# Patient Record
Sex: Male | Born: 1980 | Race: White | Hispanic: No | Marital: Married | Smoking: Current every day smoker
Health system: Southern US, Community
[De-identification: ages and names within clinical notes are randomized; demographics above are authoritative.]

## PROBLEM LIST (undated history)

## (undated) DIAGNOSIS — T8859XA Other complications of anesthesia, initial encounter: Secondary | ICD-10-CM

## (undated) DIAGNOSIS — R51 Headache: Secondary | ICD-10-CM

## (undated) DIAGNOSIS — K4090 Unilateral inguinal hernia, without obstruction or gangrene, not specified as recurrent: Secondary | ICD-10-CM

## (undated) DIAGNOSIS — R519 Headache, unspecified: Secondary | ICD-10-CM

## (undated) DIAGNOSIS — Z87442 Personal history of urinary calculi: Secondary | ICD-10-CM

## (undated) DIAGNOSIS — T4145XA Adverse effect of unspecified anesthetic, initial encounter: Secondary | ICD-10-CM

## (undated) HISTORY — PX: WISDOM TOOTH EXTRACTION: SHX21

## (undated) HISTORY — PX: FRACTURE SURGERY: SHX138

---

## 2002-02-26 ENCOUNTER — Encounter: Admission: RE | Admit: 2002-02-26 | Discharge: 2002-02-26 | Payer: Self-pay | Admitting: Family Medicine

## 2002-10-14 ENCOUNTER — Encounter: Admission: RE | Admit: 2002-10-14 | Discharge: 2002-10-14 | Payer: Self-pay | Admitting: Family Medicine

## 2002-10-16 ENCOUNTER — Encounter: Admission: RE | Admit: 2002-10-16 | Discharge: 2002-10-16 | Payer: Self-pay | Admitting: Family Medicine

## 2006-05-24 DIAGNOSIS — F17200 Nicotine dependence, unspecified, uncomplicated: Secondary | ICD-10-CM

## 2011-05-11 ENCOUNTER — Other Ambulatory Visit: Payer: Self-pay | Admitting: Gastroenterology

## 2011-05-11 DIAGNOSIS — R1011 Right upper quadrant pain: Secondary | ICD-10-CM

## 2011-05-15 ENCOUNTER — Ambulatory Visit
Admission: RE | Admit: 2011-05-15 | Discharge: 2011-05-15 | Disposition: A | Discharge status: Home / Self Care | Payer: BC Managed Care – PPO | Source: Ambulatory Visit | Attending: Gastroenterology | Admitting: Gastroenterology

## 2011-05-15 DIAGNOSIS — R1011 Right upper quadrant pain: Secondary | ICD-10-CM

## 2011-05-15 MED ORDER — IOHEXOL 300 MG/ML  SOLN
100.0000 mL | Freq: Once | INTRAMUSCULAR | Status: AC | PRN
  Administered 2011-05-15: 100 mL via INTRAVENOUS

## 2013-04-18 ENCOUNTER — Encounter (HOSPITAL_COMMUNITY): Payer: Self-pay | Admitting: Emergency Medicine

## 2013-04-18 ENCOUNTER — Emergency Department (HOSPITAL_COMMUNITY): Payer: BC Managed Care – PPO

## 2013-04-18 ENCOUNTER — Emergency Department (HOSPITAL_COMMUNITY)
Admission: EM | Admit: 2013-04-18 | Discharge: 2013-04-18 | Disposition: A | Discharge status: Home / Self Care | Payer: BC Managed Care – PPO | Attending: Emergency Medicine | Admitting: Emergency Medicine

## 2013-04-18 DIAGNOSIS — Z791 Long term (current) use of non-steroidal anti-inflammatories (NSAID): Secondary | ICD-10-CM | POA: Insufficient documentation

## 2013-04-18 DIAGNOSIS — S79919A Unspecified injury of unspecified hip, initial encounter: Secondary | ICD-10-CM | POA: Insufficient documentation

## 2013-04-18 DIAGNOSIS — S79929A Unspecified injury of unspecified thigh, initial encounter: Principal | ICD-10-CM

## 2013-04-18 DIAGNOSIS — S99929A Unspecified injury of unspecified foot, initial encounter: Secondary | ICD-10-CM

## 2013-04-18 DIAGNOSIS — Y92410 Unspecified street and highway as the place of occurrence of the external cause: Secondary | ICD-10-CM | POA: Insufficient documentation

## 2013-04-18 DIAGNOSIS — Z7982 Long term (current) use of aspirin: Secondary | ICD-10-CM | POA: Insufficient documentation

## 2013-04-18 DIAGNOSIS — S99919A Unspecified injury of unspecified ankle, initial encounter: Secondary | ICD-10-CM

## 2013-04-18 DIAGNOSIS — S8990XA Unspecified injury of unspecified lower leg, initial encounter: Secondary | ICD-10-CM | POA: Insufficient documentation

## 2013-04-18 DIAGNOSIS — F17200 Nicotine dependence, unspecified, uncomplicated: Secondary | ICD-10-CM | POA: Insufficient documentation

## 2013-04-18 DIAGNOSIS — Y9389 Activity, other specified: Secondary | ICD-10-CM | POA: Insufficient documentation

## 2013-04-18 DIAGNOSIS — M79605 Pain in left leg: Secondary | ICD-10-CM

## 2013-04-18 MED ORDER — ONDANSETRON 4 MG PO TBDP
4.0000 mg | ORAL_TABLET | Freq: Once | ORAL | Status: AC
  Administered 2013-04-18: 4 mg via ORAL
  Filled 2013-04-18: qty 1

## 2013-04-18 MED ORDER — NAPROXEN 500 MG PO TABS: 500.0000 mg | ORAL_TABLET | Freq: Two times a day (BID) | ORAL | Status: DC

## 2013-04-18 MED ORDER — OXYCODONE-ACETAMINOPHEN 5-325 MG PO TABS
1.0000 | ORAL_TABLET | Freq: Once | ORAL | Status: AC
  Administered 2013-04-18: 1 via ORAL
  Filled 2013-04-18: qty 1

## 2013-04-18 MED ORDER — TRAMADOL HCL 50 MG PO TABS: 50.0000 mg | ORAL_TABLET | Freq: Four times a day (QID) | ORAL | Status: DC | PRN

## 2013-04-18 NOTE — Discharge Instructions (Signed)
Follow up with primary care provider in 2 days if pain persists. If your pain progressively worsens please return to the ED. Refer to resource guide for follow up reference.   Emergency Department Resource Guide 1) Find a Doctor and Pay Out of Pocket Although you won't have to find out who is covered by your insurance plan, it is a good idea to ask around and get recommendations. You will then need to call the office and see if the doctor you have chosen will accept you as a new patient and what types of options they offer for patients who are self-pay. Some doctors offer discounts or will set up payment plans for their patients who do not have insurance, but you will need to ask so you aren't surprised when you get to your appointment.  2) Contact Your Local Health Department Not all health departments have doctors that can see patients for sick visits, but many do, so it is worth a call to see if yours does. If you don't know where your local health department is, you can check in your phone book. The CDC also has a tool to help you locate your state's health department, and many state websites also have listings of all of their local health departments.  3) Find a Walk-in Clinic If your illness is not likely to be very severe or complicated, you may want to try a walk in clinic. These are popping up all over the country in pharmacies, drugstores, and shopping centers. They're usually staffed by nurse practitioners or physician assistants that have been trained to treat common illnesses and complaints. They're usually fairly quick and inexpensive. However, if you have serious medical issues or chronic medical problems, these are probably not your best option.  No Primary Care Doctor: - Call Health Connect at  [phone] - they can help you locate a primary care doctor that  accepts your insurance, provides certain services, etc. - Physician Referral Service- [phone]  Chronic Pain  Problems: Organization         Address  Phone   Notes  [facility]  [phone] Patients need to be referred by their primary care doctor.   Medication Assistance: Organization         Address  Phone   Notes  [facility] [address] [place], [place] [address] [phone] --Must be a resident of [place] [place] -- Must have NO insurance coverage whatsoever (no Medicaid/ Medicare, etc.) -- The pt. MUST have a primary care doctor that directs their care regularly and follows them in the community   MedAssist  [phone]   [employer]  [phone]    Agencies that provide inexpensive medical care: Organization         Address  Phone   Notes  [facility]  [phone]   [facility]    [phone]   [facility] [address] [place], [place] [address] [phone]   [facility] [address], [place] [phone]) [phone]   [facility]anned Parenthood    [phone]   [facility]    [phone]   [facility]  [address], [place] Phone:  [phone], Fax:  [phone] Hours of Operation:  9 am - 6 pm, M-F.  Also accepts Medicaid/Medicare and self-pay.  [facility]  [address], [place] Phone: [phone], Fax: [phone]. Hours of Operation:  8:30 am - 5:30 pm, M-F.  Also accepts Medicaid and self-pay.  [facility] [address], [place] Phone: [phone]   [place], [place], [place] [phone], Ext. [phone] Mondays & Thursdays: 7-9 AM.  First 15 patients are seen on a first come, first serve basis.    [place] Providers:  Organization         Address  Phone   Notes  [facility] [address], [place] [phone] Also  accepts self-pay patients.  [facility] [address], [place]  [phone]   [place], [address], [place] [phone]   [facility] [address] [place] [phone]   Lucianne Lei [address], [place]   [phone] Only accepts [place] Access [place] patients after they have their name applied to their card.   Self-Pay (no insurance) in [facility]:  Organization         Address  Phone   Notes  Sickle Cell Patients, [facility] [employer] [phone]   [facility] [place] [phone]   [facility] [place]  [place], [place], [place] [phone]   [facility]/Dr. Osei-Bonsu  [address] [place] or [address], [place] [phone] Phone number for both [place] and [place] locations is the same.  [facility] [address], [place] [phone]   [facility] [address] [place] or [address] [phone]   [facility] [address] [place] [phone], phone; [phone], fax Sees patients 1st and 3rd Saturday of every month.  Must not qualify for public or private insurance (i.e. Medicaid, Medicare, [place] Health Choice, Veterans' Benefits)  Household income should be no more than 200% of the poverty level The clinic cannot treat you if you are pregnant or think you are pregnant  Sexually transmitted diseases are not treated at the clinic.    Dental Care: Organization         Address  Phone  Notes  [facility] of [facility] [place] [phone] Accepts children up to age 58 who are enrolled in [place] or [place]; pregnant  women with a Medicaid card; and children who have applied for Medicaid or [place] Health Choice, but were declined, whose parents can pay a reduced fee at time of service.  [facility] Department of [facility]  [address], [place] [phone] Accepts children up to age 52 who are enrolled in [place] or [place]; pregnant women with a Medicaid card; and children who have applied for Medicaid or [place] Health Choice, but were declined, whose parents can pay a reduced fee at time of service.  [facility] PRO[facility]  [place] [phone] Patients are seen by appointment only. Walk-ins are not accepted. [place] will see patients 8 years of age and older. Monday - Tuesday (8am-5pm) Most Wednesdays (8:30-5pm) $30 per visit, cash only  [place]  PROGRAM  [address], [facility] [phone] Patients are seen by appointment only. Walk-ins are not accepted. [place] will see patients 73 years of age and older. One Wednesday Evening (Monthly: Volunteer Based).  $30 per visit, cash only  [place]  [phone] for adults; Children under age 67, call Graduate Pediatric Dentistry at [phone]. Children aged 69-14, please call [phone] to request a pediatric application.  Dental services are provided in all areas of dental care including fillings, crowns and bridges, complete and partial dentures, implants, gum treatment, root canals, and extractions. Preventive care is also provided. Treatment is provided to both adults and children. Patients are selected via a lottery and there is often a waiting list.   [facility] [address], [place]  [phone] [email or web address]   [facility] [address] [place], [place] [phone], Ext. [phone] Second and Fourth Thursday of each month, opens at 6:30 AM; Clinic ends at 9 AM.  Patients are  seen on a first-come first-served basis, and a limited number are seen during each clinic.   [facility]  [address] [place], [place] [phone]   Eligibility Requirements You must have lived in [place], [place], or [place] counties for at least the last three months.   You cannot be eligible for state or federal sponsored [employer], including [employer], [place], or [employer].   You generally cannot be eligible for healthcare insurance through your employer.    How to apply: Eligibility screenings are held every Tuesday and Wednesday afternoon from 1:00 pm until 4:00 pm. You do not need an appointment for the interview!  [facility] [address], [place], [place]   [place] Department  [place]  [phone]    Behavioral Health Resources in the Community: Intensive Outpatient Programs Organization         Address  Phone  Notes  [place]. [address], [place], [place] [phone]   [facility] Outpatient [address] [place], [place]   ADS: Alcohol & Drug Svcs [address], [place], [place]   [place] [address]  [place], [place] or [phone]   Substance Abuse Resources Organization         Address  Phone  Notes  Alcohol and Drug Services  [phone]   [place]  [phone]   [facility] [place]   Chinita Pester  [phone]   Residential & Outpatient Substance Abuse Program  [phone]   Psychological Services Organization         Address  Phone  Notes  [facility] [place]  [place]  [phone]   [place] [address], [place] or [phone]    Mobile Crisis  Teams Organization         Address  Phone  Notes  [facility], Mobile Crisis Care Unit  [phone]   [facility]  [address] [place], [place]   [place] Levels [address], [place] [phone]    Self-Help/Support Groups Organization         Address  Phone             Notes  [place]  of [place] - variety of support groups  [place] Call for more information  Narcotics Anonymous (NA), [facility]aring [facility] [address], [employer] [place]  2 meetings at this location   Special educational needs teacher         Address  Phone  Notes  [facility] [place],    [place]  [phone]   [facility]  [address], [place] [address]4008, [place], [place]   [place], [place] [phone] Admissions: 8am-3pm M-F  Incentives Substance [place] [address],    [place], [place] [phone]   [facility] [address] [place], [place], [place]   [facility] [address]  [place], [place]   [facility] Programs - Intensive Outpatient [address], [place], [place]   [facility] ([place] [address].,  [place], [place] [phone] or [phone]   [facility] Services (RTS) [address], [place], [place] Accepts Medicaid  [facility] [place] [address],  [place] [phone] Substance Abuse/Addiction Treatment   [facility] Organization         Address  Phone  Notes  [facility]  [phone]   Domenic Schwab, PhD [address] [place], [place]   [phone] or [phone]   [facility], [place] [phone]   [facility] [address], [place], [place] [phone]  Insurance/Medicaid/sponsorship through [facility] Families [address]                                    [place], [place] [phone] [place] [address], [place] [phone]    Dr. Adele Schilder  [phone]   [facility] Dept. 1) [address]  2) [place] 3)  [place] [address], [place] [phone]  [phone]   [place] [phone] or [phone] (After Hours)
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
## 2013-04-24 NOTE — ED Provider Notes (Signed)
Medical screening examination/treatment/procedure(s) were performed by non-physician practitioner and as supervising physician I was immediately available for consultation/collaboration.     Geoffery Lyonsouglas English Tomer, MD 04/24/13 [phone]

## 2013-05-09 ENCOUNTER — Ambulatory Visit: Payer: BC Managed Care – PPO | Attending: Orthopaedic Surgery

## 2013-05-09 DIAGNOSIS — M25559 Pain in unspecified hip: Secondary | ICD-10-CM | POA: Insufficient documentation

## 2013-05-09 DIAGNOSIS — IMO0001 Reserved for inherently not codable concepts without codable children: Secondary | ICD-10-CM | POA: Insufficient documentation

## 2013-05-09 DIAGNOSIS — R269 Unspecified abnormalities of gait and mobility: Secondary | ICD-10-CM | POA: Insufficient documentation

## 2013-05-09 DIAGNOSIS — M2560 Stiffness of unspecified joint, not elsewhere classified: Secondary | ICD-10-CM | POA: Insufficient documentation

## 2013-05-09 DIAGNOSIS — M25569 Pain in unspecified knee: Secondary | ICD-10-CM | POA: Insufficient documentation

## 2013-05-12 ENCOUNTER — Ambulatory Visit: Payer: BC Managed Care – PPO

## 2013-05-14 ENCOUNTER — Ambulatory Visit: Payer: BC Managed Care – PPO

## 2013-05-19 ENCOUNTER — Ambulatory Visit: Payer: BC Managed Care – PPO | Admitting: Physical Therapy

## 2013-05-21 ENCOUNTER — Ambulatory Visit: Payer: BC Managed Care – PPO

## 2013-05-26 ENCOUNTER — Ambulatory Visit: Payer: BC Managed Care – PPO | Attending: Orthopaedic Surgery | Admitting: Physical Therapy

## 2013-05-26 DIAGNOSIS — M25569 Pain in unspecified knee: Secondary | ICD-10-CM | POA: Insufficient documentation

## 2013-05-26 DIAGNOSIS — IMO0001 Reserved for inherently not codable concepts without codable children: Secondary | ICD-10-CM | POA: Insufficient documentation

## 2013-05-26 DIAGNOSIS — M25559 Pain in unspecified hip: Secondary | ICD-10-CM | POA: Insufficient documentation

## 2013-05-26 DIAGNOSIS — R269 Unspecified abnormalities of gait and mobility: Secondary | ICD-10-CM | POA: Insufficient documentation

## 2013-05-26 DIAGNOSIS — M2560 Stiffness of unspecified joint, not elsewhere classified: Secondary | ICD-10-CM | POA: Insufficient documentation

## 2013-05-29 ENCOUNTER — Ambulatory Visit: Payer: BC Managed Care – PPO | Admitting: Physical Therapy

## 2013-06-02 ENCOUNTER — Encounter: Payer: BC Managed Care – PPO | Admitting: Physical Therapy

## 2014-08-03 NOTE — Progress Notes (Signed)
Dr Derrell Lollingamirez--- Need PRE OP ORDERS PLEASE--- has  PST appt 08/09/16  Thanks

## 2014-08-06 ENCOUNTER — Ambulatory Visit: Payer: Self-pay | Admitting: General Surgery

## 2014-08-06 NOTE — H&P (Signed)
History of Present Illness (Daissy Yerian MD; 07/20/2014 3:43 PM) Patient words: RIH.  The patient is a 33 year old male who presents with an inguinal hernia. 33-year-old male who is referred by Scott long, PA-C for evaluation of a right inguinal hernia. The patient works with [employer] and does heavy lifting. Patient states she's notices hernia to the right side with some pain radiating down the inner right thigh. He does notice a bulge. He's had no signs or symptoms of incarceration or strangulation.   Other Problems ([name], CMA; 07/20/2014 3:18 PM) No pertinent past medical history  Diagnostic Studies History ([name], CMA; 07/20/2014 3:18 PM) Colonoscopy never  Allergies ([name], CMA; 07/20/2014 3:19 PM) Codeine Phosphate *ANALGESICS - OPIOID*  Medication History ([name], CMA; 07/20/2014 3:20 PM) Aspirin EC (81MG Tablet DR, Oral) Active. Hydrocodone-Acetaminophen (2.5-500MG Tablet, Oral) Active. Cipro (250MG Tablet, Oral) Active. Medications Reconciled  Social History ([name], CMA; 07/20/2014 3:18 PM) Alcohol use Occasional alcohol use. Caffeine use Coffee, Tea. No drug use Tobacco use Current every day smoker.  Family History ([name], CMA; 07/20/2014 3:18 PM) Colon Cancer Family Members In General.  Review of Systems ([name] CMA; 07/20/2014 3:18 PM) General Present- Appetite Loss and Fatigue. Not Present- Chills, Fever, Night Sweats, Weight Gain and Weight Loss. Skin Present- Rash. Not Present- Change in Wart/Mole, Dryness, Hives, Jaundice, New Lesions, Non-Healing Wounds and Ulcer. HEENT Present- Seasonal Allergies, Sinus Pain and Sore Throat. Not Present- Earache, Hearing Loss, Hoarseness, Nose Bleed, Oral Ulcers, Ringing in the Ears, Visual Disturbances, Wears glasses/contact lenses and Yellow Eyes. Gastrointestinal Present- Abdominal Pain, Bloating, Change in Bowel Habits, Constipation, Excessive gas, Gets full quickly at  meals, Nausea and Rectal Pain. Not Present- Bloody Stool, Chronic diarrhea, Difficulty Swallowing, Hemorrhoids, Indigestion and Vomiting. Male Genitourinary Present- Frequency. Not Present- Blood in Urine, Change in Urinary Stream, Impotence, Nocturia, Painful Urination, Urgency and Urine Leakage. Musculoskeletal Present- Back Pain, Joint Pain, Joint Stiffness and Muscle Pain. Not Present- Muscle Weakness and Swelling of Extremities. Neurological Present- Headaches, Numbness and Trouble walking. Not Present- Decreased Memory, Fainting, Seizures, Tingling, Tremor and Weakness. Psychiatric Present- Change in Sleep Pattern and Fearful. Not Present- Anxiety, Bipolar, Depression and Frequent crying.   Vitals ([name] CMA; 07/20/2014 3:19 PM) 07/20/2014 3:18 PM Weight: 172 lb Height: 72in Body Surface Area: 1.99 m Body Mass Index: 23.33 kg/m Temp.: 98F(Temporal)  Pulse: 90 (Regular)  BP: 124/78 (Sitting, Left Arm, Standard)    Physical Exam (Parley Pidcock MD; 07/20/2014 3:40 PM) General Mental Status-Alert. General Appearance-Consistent with stated age. Hydration-Well hydrated. Voice-Normal.  Head and Neck Head-normocephalic, atraumatic with no lesions or palpable masses. Trachea-midline. Thyroid Gland Characteristics - normal size and consistency.  Chest and Lung Exam Chest and lung exam reveals -quiet, even and easy respiratory effort with no use of accessory muscles and on auscultation, normal breath sounds, no adventitious sounds and normal vocal resonance. Inspection Chest Wall - Normal. Back - normal.  Cardiovascular Cardiovascular examination reveals -normal heart sounds, regular rate and rhythm with no murmurs and normal pedal pulses bilaterally.  Abdomen Inspection Skin - Scar - no surgical scars. Hernias - Inguinal hernia - Bilateral - Reducible. Palpation/Percussion Normal exam - Soft, Non Tender, No Rebound tenderness, No Rigidity  (guarding) and No hepatosplenomegaly. Auscultation Normal exam - Bowel sounds normal.    Assessment & Plan (Jaxiel Kines MD; 07/20/2014 3:44 PM) BILATERAL INGUINAL HERNIA WITHOUT OBSTRUCTION OR GANGRENE, RECURRENCE NOT SPECIFIED (550.92  K40.20) Impression: 33-year-old male with right angle hernia left   inguinal floor weakness.  1. The patient will like to proceed to the operative for laparoscopic bilateral inguinal hernia repair with mesh. 2. All risks and benefits were discussed with the patient to generally include, but not limited to: infection, bleeding, damage to surrounding structures, acute and chronic nerve pain, and recurrence. Alternatives were offered and described. All questions were answered and the patient voiced understanding of the procedure and wishes to proceed at this point with hernia repair.

## 2014-08-07 NOTE — Patient Instructions (Addendum)
Henry Peck  08/07/2014   Your procedure is scheduled on: 08-14-2014  [facility]  Entrance and follow signs to               Short Stay Center at 9:30 AM.  Call this number if you have problems the morning of surgery [phone]   Remember: ONLY 1 PERSON MAY GO WITH YOU TO SHORT STAY TO GET  READY MORNING OF YOUR SURGERY.  Do not eat food or drink liquids :After Midnight.                                 You may not have any metal on your body including hair pins and              piercings  Do not wear jewelry, make-up, lotions, powders or perfumes, deodorant             Do not wear nail polish.  Do not shave  48 hours prior to surgery.              Men may shave face and neck.  Do not bring valuables to the hospital. [facility] IS NOT             RESPONSIBLE   FOR VALUABLES.  Contacts, dentures or bridgework may not be worn into surgery.  Leave suitcase in the car. After surgery it may be brought to your room.     Patients discharged the day of surgery will not be allowed to drive home.  Name and phone number of your driver: Henry CosierCrystal (wife) [phone]    _____________________________________________________________________           [facility] - Preparing for Surgery Before surgery, you can play an important role.  Because skin is not sterile, your skin needs to be as free of germs as possible.  You can reduce the number of germs on your skin by washing with CHG (chlorahexidine gluconate) soap before surgery.  CHG is an antiseptic cleaner which kills germs and bonds with the skin to continue killing germs even after washing. Please DO NOT use if you have an allergy to CHG or antibacterial soaps.  If your skin becomes reddened/irritated stop using the CHG and inform your nurse when you arrive at Short Stay. Do not shave (including legs and underarms) for at least 48 hours prior to the first CHG shower.  You may shave your face/neck. Please  follow these instructions carefully:  1.  Shower with CHG Soap the night before surgery and the  morning of Surgery.  2.  If you choose to wash your hair, wash your hair first as usual with your  normal  shampoo.  3.  After you shampoo, rinse your hair and body thoroughly to remove the  shampoo.                            4.  Use CHG as you would any other liquid soap.  You can apply chg directly  to the skin and wash                       Gently with a scrungie or clean washcloth.  5.  Apply the CHG Soap to your body ONLY FROM THE NECK  DOWN.   Do not use on face/ open                           Wound or open sores. Avoid contact with eyes, ears mouth and genitals (private parts).                       Wash face,  Genitals (private parts) with your normal soap.             6.  Wash thoroughly, paying special attention to the area where your surgery  will be performed.  7.  Thoroughly rinse your body with warm water from the neck down.  8.  DO NOT shower/wash with your normal soap after using and rinsing off  the CHG Soap.                9.  Pat yourself dry with a clean towel.            10.  Wear clean pajamas.            11.  Place clean sheets on your bed the night of your first shower and do not  sleep with pets. Day of Surgery : Do not apply any lotions/deodorants the morning of surgery.  Please wear clean clothes to the hospital/surgery center.  FAILURE TO FOLLOW THESE INSTRUCTIONS MAY RESULT IN THE CANCELLATION OF YOUR SURGERY PATIENT SIGNATURE_________________________________  NURSE SIGNATURE__________________________________  ________________________________________________________________________

## 2014-08-10 ENCOUNTER — Encounter (HOSPITAL_COMMUNITY): Payer: Self-pay

## 2014-08-10 ENCOUNTER — Encounter (HOSPITAL_COMMUNITY)
Admission: RE | Admit: 2014-08-10 | Discharge: 2014-08-10 | Disposition: A | Discharge status: Home / Self Care | Payer: BLUE CROSS/BLUE SHIELD | Source: Ambulatory Visit | Attending: General Surgery | Admitting: General Surgery

## 2014-08-10 DIAGNOSIS — Z01812 Encounter for preprocedural laboratory examination: Secondary | ICD-10-CM | POA: Insufficient documentation

## 2014-08-10 DIAGNOSIS — K4020 Bilateral inguinal hernia, without obstruction or gangrene, not specified as recurrent: Secondary | ICD-10-CM | POA: Diagnosis not present

## 2014-08-10 HISTORY — DX: Other complications of anesthesia, initial encounter: T88.59XA

## 2014-08-10 HISTORY — DX: Headache, unspecified: R51.9

## 2014-08-10 HISTORY — DX: Unilateral inguinal hernia, without obstruction or gangrene, not specified as recurrent: K40.90

## 2014-08-10 HISTORY — DX: Adverse effect of unspecified anesthetic, initial encounter: T41.45XA

## 2014-08-10 HISTORY — DX: Headache: R51

## 2014-08-10 HISTORY — DX: Personal history of urinary calculi: Z87.442

## 2014-08-10 LAB — CBC
HCT: 46.6 % (ref 39.0–52.0)
Hemoglobin: 15.8 g/dL (ref 13.0–17.0)
MCH: 31.5 pg (ref 26.0–34.0)
MCHC: 33.9 g/dL (ref 30.0–36.0)
MCV: 92.8 fL (ref 78.0–100.0)
Platelets: 381 10*3/uL (ref 150–400)
RBC: 5.02 MIL/uL (ref 4.22–5.81)
RDW: 12.8 % (ref 11.5–15.5)
WBC: 9.3 10*3/uL (ref 4.0–10.5)

## 2014-08-14 ENCOUNTER — Ambulatory Visit (HOSPITAL_COMMUNITY)
Admission: RE | Admit: 2014-08-14 | Discharge: 2014-08-14 | Disposition: A | Discharge status: Home / Self Care | Payer: BLUE CROSS/BLUE SHIELD | Source: Ambulatory Visit | Attending: General Surgery | Admitting: General Surgery

## 2014-08-14 ENCOUNTER — Ambulatory Visit (HOSPITAL_COMMUNITY): Payer: BLUE CROSS/BLUE SHIELD | Admitting: Anesthesiology

## 2014-08-14 ENCOUNTER — Encounter (HOSPITAL_COMMUNITY): Payer: Self-pay | Admitting: *Deleted

## 2014-08-14 ENCOUNTER — Encounter (HOSPITAL_COMMUNITY)
Admission: RE | Disposition: A | Discharge status: Home / Self Care | Payer: Self-pay | Source: Ambulatory Visit | Attending: General Surgery

## 2014-08-14 DIAGNOSIS — K4020 Bilateral inguinal hernia, without obstruction or gangrene, not specified as recurrent: Secondary | ICD-10-CM | POA: Insufficient documentation

## 2014-08-14 DIAGNOSIS — Z79891 Long term (current) use of opiate analgesic: Secondary | ICD-10-CM | POA: Diagnosis not present

## 2014-08-14 DIAGNOSIS — F17200 Nicotine dependence, unspecified, uncomplicated: Secondary | ICD-10-CM | POA: Insufficient documentation

## 2014-08-14 DIAGNOSIS — Z792 Long term (current) use of antibiotics: Secondary | ICD-10-CM | POA: Diagnosis not present

## 2014-08-14 DIAGNOSIS — Z7982 Long term (current) use of aspirin: Secondary | ICD-10-CM | POA: Insufficient documentation

## 2014-08-14 HISTORY — PX: INSERTION OF MESH: SHX5868

## 2014-08-14 HISTORY — PX: INGUINAL HERNIA REPAIR: SHX194

## 2014-08-14 SURGERY — REPAIR, HERNIA, INGUINAL, BILATERAL, LAPAROSCOPIC
Anesthesia: General | Site: Groin | Laterality: Bilateral

## 2014-08-14 MED ORDER — BUPIVACAINE-EPINEPHRINE (PF) 0.25% -1:200000 IJ SOLN
INTRAMUSCULAR | Status: AC
  Filled 2014-08-14: qty 30

## 2014-08-14 MED ORDER — CEFAZOLIN SODIUM-DEXTROSE 2-3 GM-% IV SOLR
INTRAVENOUS | Status: AC
  Filled 2014-08-14: qty 50

## 2014-08-14 MED ORDER — PROPOFOL 10 MG/ML IV BOLUS
INTRAVENOUS | Status: AC
  Filled 2014-08-14: qty 20

## 2014-08-14 MED ORDER — MORPHINE SULFATE 10 MG/ML IJ SOLN: 1.0000 mg | INTRAMUSCULAR | Status: DC | PRN

## 2014-08-14 MED ORDER — CEFAZOLIN SODIUM-DEXTROSE 2-3 GM-% IV SOLR
2.0000 g | INTRAVENOUS | Status: AC
  Administered 2014-08-14: 2 g via INTRAVENOUS

## 2014-08-14 MED ORDER — SODIUM CHLORIDE 0.9 % IJ SOLN: 3.0000 mL | Freq: Two times a day (BID) | INTRAMUSCULAR | Status: DC

## 2014-08-14 MED ORDER — ROCURONIUM BROMIDE 100 MG/10ML IV SOLN
INTRAVENOUS | Status: AC
  Filled 2014-08-14: qty 1

## 2014-08-14 MED ORDER — NEOSTIGMINE METHYLSULFATE 10 MG/10ML IV SOLN
INTRAVENOUS | Status: DC | PRN
  Administered 2014-08-14: 4 mg via INTRAVENOUS

## 2014-08-14 MED ORDER — HYDROMORPHONE HCL 1 MG/ML IJ SOLN
0.2500 mg | INTRAMUSCULAR | Status: DC | PRN
  Administered 2014-08-14: 0.25 mg via INTRAVENOUS
  Administered 2014-08-14: 0.5 mg via INTRAVENOUS
  Administered 2014-08-14: 0.25 mg via INTRAVENOUS

## 2014-08-14 MED ORDER — MIDAZOLAM HCL 2 MG/2ML IJ SOLN
INTRAMUSCULAR | Status: AC
  Filled 2014-08-14: qty 2

## 2014-08-14 MED ORDER — METOCLOPRAMIDE HCL 5 MG/ML IJ SOLN
INTRAMUSCULAR | Status: DC | PRN
  Administered 2014-08-14: 10 mg via INTRAVENOUS

## 2014-08-14 MED ORDER — OXYCODONE-ACETAMINOPHEN 5-325 MG PO TABS: 1.0000 | ORAL_TABLET | ORAL | Status: DC | PRN

## 2014-08-14 MED ORDER — LACTATED RINGERS IV SOLN
INTRAVENOUS | Status: DC
  Administered 2014-08-14: 1000 mL via INTRAVENOUS

## 2014-08-14 MED ORDER — LACTATED RINGERS IV SOLN
INTRAVENOUS | Status: DC | PRN
  Administered 2014-08-14: 1000 mL

## 2014-08-14 MED ORDER — LACTATED RINGERS IV SOLN: INTRAVENOUS | Status: DC

## 2014-08-14 MED ORDER — PROPOFOL 10 MG/ML IV BOLUS
INTRAVENOUS | Status: DC | PRN
  Administered 2014-08-14: 200 mg via INTRAVENOUS

## 2014-08-14 MED ORDER — ONDANSETRON HCL 4 MG/2ML IJ SOLN
INTRAMUSCULAR | Status: DC | PRN
  Administered 2014-08-14: 4 mg via INTRAVENOUS

## 2014-08-14 MED ORDER — ONDANSETRON HCL 4 MG/2ML IJ SOLN
INTRAMUSCULAR | Status: AC
  Filled 2014-08-14: qty 2

## 2014-08-14 MED ORDER — OXYCODONE HCL 5 MG PO TABS
5.0000 mg | ORAL_TABLET | ORAL | Status: DC | PRN
  Administered 2014-08-14 (×2): 5 mg via ORAL
  Filled 2014-08-14 (×2): qty 1

## 2014-08-14 MED ORDER — ROCURONIUM BROMIDE 100 MG/10ML IV SOLN
INTRAVENOUS | Status: DC | PRN
  Administered 2014-08-14: 45 mg via INTRAVENOUS
  Administered 2014-08-14: 10 mg via INTRAVENOUS

## 2014-08-14 MED ORDER — NEOSTIGMINE METHYLSULFATE 10 MG/10ML IV SOLN
INTRAVENOUS | Status: AC
  Filled 2014-08-14: qty 1

## 2014-08-14 MED ORDER — MIDAZOLAM HCL 5 MG/5ML IJ SOLN
INTRAMUSCULAR | Status: DC | PRN
  Administered 2014-08-14: 2 mg via INTRAVENOUS

## 2014-08-14 MED ORDER — LIDOCAINE HCL (CARDIAC) 20 MG/ML IV SOLN
INTRAVENOUS | Status: AC
Start: 2014-08-14 — End: 2014-08-14
  Filled 2014-08-14: qty 5

## 2014-08-14 MED ORDER — FENTANYL CITRATE (PF) 250 MCG/5ML IJ SOLN
INTRAMUSCULAR | Status: AC
  Filled 2014-08-14: qty 5

## 2014-08-14 MED ORDER — LIDOCAINE HCL (CARDIAC) 20 MG/ML IV SOLN
INTRAVENOUS | Status: DC | PRN
  Administered 2014-08-14: 50 mg via INTRAVENOUS

## 2014-08-14 MED ORDER — GLYCOPYRROLATE 0.2 MG/ML IJ SOLN
INTRAMUSCULAR | Status: AC
  Filled 2014-08-14: qty 3

## 2014-08-14 MED ORDER — ACETAMINOPHEN 325 MG PO TABS: 650.0000 mg | ORAL_TABLET | ORAL | Status: DC | PRN

## 2014-08-14 MED ORDER — HYDROMORPHONE HCL 1 MG/ML IJ SOLN
INTRAMUSCULAR | Status: AC
  Filled 2014-08-14: qty 1

## 2014-08-14 MED ORDER — SODIUM CHLORIDE 0.9 % IV SOLN: 250.0000 mL | INTRAVENOUS | Status: DC | PRN

## 2014-08-14 MED ORDER — LACTATED RINGERS IV SOLN
INTRAVENOUS | Status: DC | PRN
  Administered 2014-08-14 (×2): via INTRAVENOUS

## 2014-08-14 MED ORDER — 0.9 % SODIUM CHLORIDE (POUR BTL) OPTIME
TOPICAL | Status: DC | PRN
  Administered 2014-08-14: 1000 mL

## 2014-08-14 MED ORDER — SODIUM CHLORIDE 0.9 % IJ SOLN: 3.0000 mL | INTRAMUSCULAR | Status: DC | PRN

## 2014-08-14 MED ORDER — GLYCOPYRROLATE 0.2 MG/ML IJ SOLN
INTRAMUSCULAR | Status: DC | PRN
  Administered 2014-08-14: 0.6 mg via INTRAVENOUS

## 2014-08-14 MED ORDER — FENTANYL CITRATE (PF) 100 MCG/2ML IJ SOLN
INTRAMUSCULAR | Status: DC | PRN
  Administered 2014-08-14: 100 ug via INTRAVENOUS
  Administered 2014-08-14 (×2): 50 ug via INTRAVENOUS

## 2014-08-14 MED ORDER — ACETAMINOPHEN 650 MG RE SUPP
650.0000 mg | RECTAL | Status: DC | PRN
  Filled 2014-08-14: qty 1

## 2014-08-14 MED ORDER — BUPIVACAINE-EPINEPHRINE 0.25% -1:200000 IJ SOLN
INTRAMUSCULAR | Status: DC | PRN
  Administered 2014-08-14: 8 mL

## 2014-08-14 MED ORDER — LIP MEDEX EX OINT
TOPICAL_OINTMENT | CUTANEOUS | Status: AC
  Filled 2014-08-14: qty 7

## 2014-08-14 SURGICAL SUPPLY — 42 items
APL SKNCLS STERI-STRIP NONHPOA (GAUZE/BANDAGES/DRESSINGS) ×1
BAG URINE DRAINAGE (UROLOGICAL SUPPLIES) IMPLANT
BENZOIN TINCTURE PRP APPL 2/3 (GAUZE/BANDAGES/DRESSINGS) ×3 IMPLANT
CABLE HIGH FREQUENCY MONO STRZ (ELECTRODE) ×3 IMPLANT
CATH FOLEY 3WAY  5CC 16FR (CATHETERS)
CATH FOLEY 3WAY 5CC 16FR (CATHETERS) IMPLANT
CLOSURE WOUND 1/2 X4 (GAUZE/BANDAGES/DRESSINGS) ×1
CONNECTOR 5 IN 1 STRAIGHT STRL (MISCELLANEOUS) IMPLANT
DECANTER SPIKE VIAL GLASS SM (MISCELLANEOUS) ×1 IMPLANT
DRAPE LAPAROSCOPIC ABDOMINAL (DRAPES) ×3 IMPLANT
ELECT REM PT RETURN 9FT ADLT (ELECTROSURGICAL) ×3
ELECTRODE REM PT RTRN 9FT ADLT (ELECTROSURGICAL) ×1 IMPLANT
GAUZE SPONGE 2X2 8PLY STRL LF (GAUZE/BANDAGES/DRESSINGS) ×1 IMPLANT
GLOVE BIO SURGEON STRL SZ 6.5 (GLOVE) ×1 IMPLANT
GLOVE BIO SURGEON STRL SZ7.5 (GLOVE) ×3 IMPLANT
GLOVE BIO SURGEONS STRL SZ 6.5 (GLOVE) ×1
GLOVE BIOGEL PI IND STRL 6.5 (GLOVE) IMPLANT
GLOVE BIOGEL PI IND STRL 7.0 (GLOVE) IMPLANT
GLOVE BIOGEL PI INDICATOR 6.5 (GLOVE) ×2
GLOVE BIOGEL PI INDICATOR 7.0 (GLOVE) ×2
GOWN STRL REUS W/TWL XL LVL3 (GOWN DISPOSABLE) ×8 IMPLANT
KIT BASIN OR (CUSTOM PROCEDURE TRAY) ×3 IMPLANT
MESH 3DMAX 4X6 LT LRG (Mesh General) ×2 IMPLANT
MESH 3DMAX 4X6 RT LRG (Mesh General) ×2 IMPLANT
NDL INSUFFLATION 14GA 120MM (NEEDLE) IMPLANT
NEEDLE INSUFFLATION 14GA 120MM (NEEDLE) ×3 IMPLANT
PLUG CATH AND CAP STER (CATHETERS) IMPLANT
RELOAD STAPLE 4.0 BLU F/HERNIA (INSTRUMENTS) ×1 IMPLANT
RELOAD STAPLE HERNIA 4.0 BLUE (INSTRUMENTS) ×3 IMPLANT
SCISSORS LAP 5X35 DISP (ENDOMECHANICALS) ×2 IMPLANT
SET IRRIG TUBING LAPAROSCOPIC (IRRIGATION / IRRIGATOR) ×2 IMPLANT
SET IRRIG Y TYPE TUR BLADDER L (SET/KITS/TRAYS/PACK) IMPLANT
SPONGE GAUZE 2X2 STER 10/PKG (GAUZE/BANDAGES/DRESSINGS) ×2
STAPLER HERNIA 12 8.5 360D (INSTRUMENTS) ×3 IMPLANT
STRIP CLOSURE SKIN 1/2X4 (GAUZE/BANDAGES/DRESSINGS) ×2 IMPLANT
SUT MNCRL AB 4-0 PS2 18 (SUTURE) ×3 IMPLANT
TOWEL OR 17X26 10 PK STRL BLUE (TOWEL DISPOSABLE) ×3 IMPLANT
TRAY FOLEY W/METER SILVER 14FR (SET/KITS/TRAYS/PACK) ×1 IMPLANT
TRAY FOLEY W/METER SILVER 16FR (SET/KITS/TRAYS/PACK) ×2 IMPLANT
TRAY LAPAROSCOPIC (CUSTOM PROCEDURE TRAY) ×3 IMPLANT
TROCAR CANNULA W/PORT DUAL 5MM (MISCELLANEOUS) ×3 IMPLANT
TROCAR XCEL 12X100 BLDLESS (ENDOMECHANICALS) ×3 IMPLANT

## 2014-08-14 NOTE — Op Note (Signed)
08/14/2014  12:36 PM  PATIENT:  Henry Peck  34 y.o. male  PRE-OPERATIVE DIAGNOSIS:  Bilateral Inguinal Hernia  POST-OPERATIVE DIAGNOSIS:  Bilateral Inguinal Hernia  PROCEDURE:  Procedure(s): LAPAROSCOPIC BILATERAL INDIRECT  INGUINAL HERNIA REPAIR (Bilateral) INSERTION OF MESH (Bilateral)  SURGEON:  Surgeon(s) and Role:    * Axel FillerArmando Shahab Polhamus, MD - Primary  ANESTHESIA:   local and general  EBL:   <5cc  BLOOD ADMINISTERED:none  DRAINS: none   LOCAL MEDICATIONS USED:  BUPIVICAINE   SPECIMEN:  No Specimen  DISPOSITION OF SPECIMEN:  N/A  COUNTS:  YES  TOURNIQUET:  * No tourniquets in log *  DICTATION: .Dragon Dictation Details of the procedure: The patient was taken back to the operating room. The patient was placed in supine position with bilateral SCDs in place.  The patient was prepped and draped in the usual sterile fashion.  After appropriate anitbiotics were confirmed, a time-out was confirmed and all facts were verified.  0.25% Marcaine was used to infiltrate the umbilical area. A 11-blade was used to cut down the skin and blunt dissection was used to get the anterior fashion.  The anterior fascia was incised approximately 1 cm and the muscles were retracted laterally. Blunt dissection was then used to create a space in the preperitoneal area on the right side. At this time a 10 mm camera was then introduced into the space and advanced the pubic tubercle and a 12 mm trocar was placed over this and insufflation was started.  At this time and space was created from medial to laterally the preperitoneal space on the right side.  Cooper's ligament was initially cleaned off.  The small hernia sac was identified in the right indirect space. Dissection of the hernia sac was undertaken the vas deferens was identified and protected in all parts of the case.   Once the hernia sac was taken down to approximately the umbilicus, a Bard 3D Max, Large, mesh was  introduced into the  preperitoneal space.  The mesh was brought over the direct and indirect hernia spaces.  This was anchored into place and secured to Cooper's ligament with 4.240mm staples from a [place] hernia stapler. It was anchored to the anterior abdominal wall with 4.8 mm staples. The hernia sac was seen lying posterior to the mesh. There was no staples placed laterally.   The exact same dissection took place on the left side.  There was a small indirect hernia and also weakness of the direct space.  The Bard 3D max, Large mesh was placed to cover both the direct and indirect spaces.  This was anchored into place and secured to Cooper's ligament with 4.720mm staples from a [place] hernia stapler. It was anchored to the anterior abdominal wall with 4.8 mm staples. The hernia sac was seen lying posterior to the mesh. There was no staples placed laterally.   The insufflation was evacuated. The trochars were removed. The anterior fascia was reapproximated using #1 Vicryl on a UR- 6.  Intra-abdominal air was evacuated and the Veress needle removed. The skin was reapproximated using 4-0 Monocryl subcuticular fashion the patient was awakened from general anesthesia and taken to recovery in stable condition.  PLAN OF CARE: Discharge to home after PACU  PATIENT DISPOSITION:  PACU - hemodynamically stable.   Delay start of Pharmacological VTE agent (>24hrs) due to surgical blood loss or risk of bleeding: not applicable

## 2014-08-14 NOTE — H&P (View-Only) (Signed)
History of Present Illness ([name] MD; 07/20/2014 3:43 PM) Patient words: RIH.  The patient is a 34 year old male who presents with an inguinal hAxel Fillerernia. 34 year old male who is referred by [name] long, PA-C for evaluation of a right inguinal hernia. The patient works with [employer] and does heavy lifting. Patient states she's notices hernia to the right side with some pain radiating down the inner right thigh. He does notice a bulge. He's had no signs or symptoms of incarceration or strangulation.   Other Problems [name]([name], CMA; 07/20/2014 3:18 PM) No pertinent past medical history  Diagnostic Studies History [name]([name], CMA; 07/20/2014 3:18 PM) Colonoscopy never  Allergies ([name], CMA; 07/20/2014 3:19 PM) Codeine Phosphate *ANALGESICS - OPIOID*  Medication History ([name], CMA; 07/20/2014 3:20 PM) Aspirin EC (81MG  Tablet DR, Oral) Active. Hydrocodone-Acetaminophen (2.5-500MG  Tablet, Oral) Active. Cipro (250MG  Tablet, Oral) Active. Medications Reconciled  Social History [name]([name], CMA; 07/20/2014 3:18 PM) Alcohol use Occasional alcohol use. Caffeine use Coffee, Tea. No drug use Tobacco use Current every day smoker.  Family History [name]([name], CMA; 07/20/2014 3:18 PM) Colon Cancer Family Members In General.  Review of Systems [name]([name] CMA; 07/20/2014 3:18 PM) General Present- Appetite Loss and Fatigue. Not Present- Chills, Fever, Night Sweats, Weight Gain and Weight Loss. Skin Present- Rash. Not Present- Change in Wart/Mole, Dryness, Hives, Jaundice, New Lesions, Non-Healing Wounds and Ulcer. HEENT Present- Seasonal Allergies, Sinus Pain and Sore Throat. Not Present- Earache, Hearing Loss, Hoarseness, Nose Bleed, Oral Ulcers, Ringing in the Ears, Visual Disturbances, Wears glasses/contact lenses and Yellow Eyes. Gastrointestinal Present- Abdominal Pain, Bloating, Change in Bowel Habits, Constipation, Excessive gas, Gets full quickly at  meals, Nausea and Rectal Pain. Not Present- Bloody Stool, Chronic diarrhea, Difficulty Swallowing, Hemorrhoids, Indigestion and Vomiting. Male Genitourinary Present- Frequency. Not Present- Blood in Urine, Change in Urinary Stream, Impotence, Nocturia, Painful Urination, Urgency and Urine Leakage. Musculoskeletal Present- Back Pain, Joint Pain, Joint Stiffness and Muscle Pain. Not Present- Muscle Weakness and Swelling of Extremities. Neurological Present- Headaches, Numbness and Trouble walking. Not Present- Decreased Memory, Fainting, Seizures, Tingling, Tremor and Weakness. Psychiatric Present- Change in Sleep Pattern and Fearful. Not Present- Anxiety, Bipolar, Depression and Frequent crying.   Vitals ([name] CMA; 07/20/2014 3:19 PM) 07/20/2014 3:18 PM Weight: 172 lb Height: 72in Body Surface Area: 1.99 m Body Mass Index: 23.33 kg/m Temp.: 63F(Temporal)  Pulse: 90 (Regular)  BP: 124/78 (Sitting, Left Arm, Standard)    Physical Exam [name]([name] MD; 07/20/2014 3:40 PM) General Mental Status-Alert. General Appearance-Consistent with stated age. Hydration-Well hydrated. Voice-Normal.  Head and Neck Head-normocephalic, atraumatic with no lesions or palpable masses. Trachea-midline. Thyroid Gland Characteristics - normal size and consistency.  Chest and Lung Exam Chest and lung exam reveals -quiet, even and easy respiratory effort with no use of accessory muscles and on auscultation, normal breath sounds, no adventitious sounds and normal vocal resonance. Inspection Chest Wall - Normal. Back - normal.  Cardiovascular Cardiovascular examination reveals -normal heart sounds, regular rate and rhythm with no murmurs and normal pedal pulses bilaterally.  Abdomen Inspection Skin - Scar - no surgical scars. Hernias - Inguinal hernia - Bilateral - Reducible. Palpation/Percussion Normal exam - Soft, Non Tender, No Rebound tenderness, No Rigidity  (guarding) and No hepatosplenomegaly. Auscultation Normal exam - Bowel sounds normal.    Assessment & Plan [name]([name] MD; 07/20/2014 3:44 PM) BILATERAL INGUINAL HERNIA WITHOUT OBSTRUCTION OR GANGRENE, RECURRENCE NOT SPECIFIED (550.92  K40.20) Impression: 34 year old male with right angle hernia left  inguinal floor weakness.  1. The patient will like to proceed to the operative for laparoscopic bilateral inguinal hernia repair with mesh. 2. All risks and benefits were discussed with the patient to generally include, but not limited to: infection, bleeding, damage to surrounding structures, acute and chronic nerve pain, and recurrence. Alternatives were offered and described. All questions were answered and the patient voiced understanding of the procedure and wishes to proceed at this point with hernia repair.

## 2014-08-14 NOTE — Interval H&P Note (Signed)
History and Physical Interval Note:  08/14/2014 7:42 AM  Henry RaddleSteven A Totton  has presented today for surgery, with the diagnosis of Bilateral Inguinal Hernia  The various methods of treatment have been discussed with the patient and family. After consideration of risks, benefits and other options for treatment, the patient has consented to  Procedure(s): LAPAROSCOPIC BILATERAL INGUINAL HERNIA REPAIR (Bilateral) INSERTION OF MESH (Bilateral) as a surgical intervention .  The patient's history has been reviewed, patient examined, no change in status, stable for surgery.  I have reviewed the patient's chart and labs.  Questions were answered to the patient's satisfaction.     Marigene Ehlersamirez Jr., Jed LimerickArmando

## 2014-08-14 NOTE — Discharge Instructions (Signed)
General Anesthesia, Care After Refer to this sheet in the next few weeks. These instructions provide you with information on caring for yourself after your procedure. Your health care provider may also give you more specific instructions. Your treatment has been planned according to current medical practices, but problems sometimes occur. Call your health care provider if you have any problems or questions after your procedure. WHAT TO EXPECT AFTER THE PROCEDURE After the procedure, it is typical to experience: Sleepiness. Nausea and vomiting. HOME CARE INSTRUCTIONS For the first 24 hours after general anesthesia: Have a responsible person with you. Do not drive a car. If you are alone, do not take public transportation. Do not drink alcohol. Do not take medicine that has not been prescribed by your health care provider. Do not sign important papers or make important decisions. You may resume a normal diet and activities as directed by your health care provider. Change bandages (dressings) as directed. If you have questions or problems that seem related to general anesthesia, call the hospital and ask for the anesthetist or anesthesiologist on call. SEEK MEDICAL CARE IF: You have nausea and vomiting that continue the day after anesthesia. You develop a rash. SEEK IMMEDIATE MEDICAL CARE IF:  You have difficulty breathing. You have chest pain. You have any allergic problems. Document Released: 06/19/2000 Document Revised: 03/18/2013 Document Reviewed: 09/26/2012 [facility] Patient Information 2015 [place], [place]. This information is not intended to replace advice given to you by your health care provider. Make sure you discuss any questions you have with your health care provider. Hernia Repair with Laparoscope A hernia occurs when an internal organ pushes out through a weak spot in the belly (abdominal) wall muscles. Hernias most commonly occur in the groin and around the navel.  Hernias can also occur through a cut by the surgeon (incision) after an abdominal operation. A hernia may be caused by:  Lifting heavy objects.  Prolonged coughing.  Straining to move your bowels. Hernias can often be pushed back into place (reduced). Most hernias tend to get worse over time. Problems occur when abdominal contents get stuck in the opening and the blood supply is blocked or impaired (incarcerated hernia). Because of these risks, you require surgery to repair the hernia. Your hernia will be repaired using a laparoscope. Laparoscopic surgery is a type of minimally invasive surgery. It does not involve making a typical surgical cut (incision) in the skin. A laparoscope is a telescope-like rod and lens system. It is usually connected to a video camera and a light source so your caregiver can clearly see the operative area. The instruments are inserted through  to  inch (5 mm or 10 mm) openings in the skin at specific locations. A working and viewing space is created by blowing a small amount of carbon dioxide gas into the abdominal cavity. The abdomen is essentially blown up like a balloon (insufflated). This elevates the abdominal wall above the internal organs like a dome. The carbon dioxide gas is common to the human body and can be absorbed by tissue and removed by the respiratory system. Once the repair is completed, the small incisions will be closed with either stitches (sutures) or staples (just like a paper stapler only this staple holds the skin together). LET YOUR CAREGIVERS KNOW ABOUT:  Allergies.  Medications taken including herbs, eye drops, over the counter medications, and creams.  Use of steroids (by mouth or creams).  Previous problems with anesthetics or Novocaine.  Possibility of pregnancy, if this applies.  History of blood clots (thrombophlebitis).  History of bleeding or blood problems.  Previous surgery.  Other health problems. BEFORE THE PROCEDURE    Laparoscopy can be done either in a hospital or out-patient clinic. You may be given a mild sedative to help you relax before the procedure. Once in the operating room, you will be given a general anesthesia to make you sleep (unless you and your caregiver choose a different anesthetic).  AFTER THE PROCEDURE  After the procedure you will be watched in a recovery area. Depending on what type of hernia was repaired, you might be admitted to the hospital or you might go home the same day. With this procedure you may have less pain and scarring. This usually results in a quicker recovery and less risk of infection. HOME CARE INSTRUCTIONS   Bed rest is not required. You may continue your normal activities but avoid heavy lifting (more than 10 pounds) or straining.  Cough gently. If you are a smoker it is best to stop, as even the best hernia repair can break down with the continual strain of coughing.  Avoid driving until given the OK by your surgeon.  There are no dietary restrictions unless given otherwise.  TAKE ALL MEDICATIONS AS DIRECTED.  Only take over-the-counter or prescription medicines for pain, discomfort, or fever as directed by your caregiver. SEEK MEDICAL CARE IF:   There is increasing abdominal pain or pain in your incisions.  There is more bleeding from incisions, other than minimal spotting.  You feel light headed or faint.  You develop an unexplained fever, chills, and/or an oral temperature above 102 F (38.9 C).  You have redness, swelling, or increasing pain in the wound.  Pus coming from wound.  A foul smell coming from the wound or dressings. SEEK IMMEDIATE MEDICAL CARE IF:   You develop a rash.  You have difficulty breathing.  You have any allergic problems. MAKE SURE YOU:   Understand these instructions.  Will watch your condition.  Will get help right away if you are not doing well or get worse. Document Released: 03/13/2005 Document Revised:  06/05/2011 Document Reviewed: 02/10/2009 [facility] Patient Information 2015 [place], [place]. This information is not intended to replace advice given to you by your health care provider. Make sure you discuss any questions you have with your health care provider.

## 2014-08-14 NOTE — Transfer of Care (Signed)
Immediate Anesthesia Transfer of Care Note  Patient: Henry Peck  Procedure(s) Performed: Procedure(s): LAPAROSCOPIC BILATERAL INGUINAL HERNIA REPAIR (Bilateral) INSERTION OF MESH (Bilateral)  Patient Location: PACU  Anesthesia Type:General  Level of Consciousness: awake, oriented and patient cooperative  Airway & Oxygen Therapy: Patient Spontanous Breathing and Patient connected to face mask oxygen  Post-op Assessment: Report given to RN and Post -op Vital signs reviewed and stable  Post vital signs: Reviewed and stable  Last Vitals:  Filed Vitals:   08/14/14 1254  BP: 116/61  Pulse:   Temp:   Resp: 14    Complications: No apparent anesthesia complications

## 2014-08-14 NOTE — Anesthesia Procedure Notes (Signed)
Procedure Name: Intubation Date/Time: 08/14/2014 11:40 AM Performed by: Durward ParcelFLYNN-COOK, Jhana Giarratano A Pre-anesthesia Checklist: Patient identified, Emergency Drugs available, Suction available, Patient being monitored and Timeout performed Patient Re-evaluated:Patient Re-evaluated prior to inductionOxygen Delivery Method: Circle system utilized Preoxygenation: Pre-oxygenation with 100% oxygen Intubation Type: IV induction Ventilation: Mask ventilation without difficulty Laryngoscope Size: Mac and 4 Grade View: Grade II Tube type: Oral Tube size: 8.0 mm Number of attempts: 1 Airway Equipment and Method: Stylet Dental Injury: Teeth and Oropharynx as per pre-operative assessment

## 2014-08-14 NOTE — Anesthesia Preprocedure Evaluation (Signed)
Anesthesia Evaluation  Patient identified by MRN, date of birth, ID band Patient awake    Reviewed: Allergy & Precautions, H&P , NPO status , Patient's Chart, lab work & pertinent test results  Airway Mallampati: II  TM Distance: >3 FB Neck ROM: full    Dental no notable dental hx. (+) Teeth Intact, Dental Advisory Given   Pulmonary neg pulmonary ROS, Current Smoker,  breath sounds clear to auscultation  Pulmonary exam normal       Cardiovascular Exercise Tolerance: Good negative cardio ROS Normal cardiovascular examRhythm:regular Rate:Normal     Neuro/Psych negative neurological ROS  negative psych ROS   GI/Hepatic negative GI ROS, Neg liver ROS,   Endo/Other  negative endocrine ROS  Renal/GU negative Renal ROS  negative genitourinary   Musculoskeletal   Abdominal   Peds  Hematology negative hematology ROS (+)   Anesthesia Other Findings   Reproductive/Obstetrics negative OB ROS                             Anesthesia Physical Anesthesia Plan  ASA: II  Anesthesia Plan: General   Post-op Pain Management:    Induction: Intravenous  Airway Management Planned: Oral ETT  Additional Equipment:   Intra-op Plan:   Post-operative Plan: Extubation in OR  Informed Consent: I have reviewed the patients History and Physical, chart, labs and discussed the procedure including the risks, benefits and alternatives for the proposed anesthesia with the patient or authorized representative who has indicated his/her understanding and acceptance.   Dental Advisory Given  Plan Discussed with: CRNA and Surgeon  Anesthesia Plan Comments:         Anesthesia Quick Evaluation

## 2014-08-14 NOTE — Anesthesia Postprocedure Evaluation (Signed)
  Anesthesia Post-op Note  Patient: Henry Peck  Procedure(s) Performed: Procedure(s) (LRB): LAPAROSCOPIC BILATERAL INGUINAL HERNIA REPAIR (Bilateral) INSERTION OF MESH (Bilateral)  Patient Location: PACU  Anesthesia Type: General  Level of Consciousness: awake and alert   Airway and Oxygen Therapy: Patient Spontanous Breathing  Post-op Pain: mild  Post-op Assessment: Post-op Vital signs reviewed, Patient's Cardiovascular Status Stable, Respiratory Function Stable, Patent Airway and No signs of Nausea or vomiting  Last Vitals:  Filed Vitals:   08/14/14 1315  BP: 126/66  Pulse: 79  Temp:   Resp: 12    Post-op Vital Signs: stable   Complications: No apparent anesthesia complications

## 2014-08-17 ENCOUNTER — Encounter (HOSPITAL_COMMUNITY): Payer: Self-pay | Admitting: General Surgery
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
## 2016-10-18 DIAGNOSIS — R1084 Generalized abdominal pain: Secondary | ICD-10-CM | POA: Diagnosis not present

## 2016-10-18 DIAGNOSIS — R195 Other fecal abnormalities: Secondary | ICD-10-CM | POA: Diagnosis not present

## 2016-10-18 DIAGNOSIS — K5900 Constipation, unspecified: Secondary | ICD-10-CM | POA: Diagnosis not present

## 2017-01-22 DIAGNOSIS — K219 Gastro-esophageal reflux disease without esophagitis: Secondary | ICD-10-CM | POA: Diagnosis not present

## 2017-01-22 DIAGNOSIS — Z6822 Body mass index (BMI) 22.0-22.9, adult: Secondary | ICD-10-CM | POA: Diagnosis not present

## 2017-01-22 DIAGNOSIS — R1011 Right upper quadrant pain: Secondary | ICD-10-CM | POA: Diagnosis not present

## 2017-01-29 DIAGNOSIS — Z0000 Encounter for general adult medical examination without abnormal findings: Secondary | ICD-10-CM | POA: Diagnosis not present

## 2017-01-29 DIAGNOSIS — Z1389 Encounter for screening for other disorder: Secondary | ICD-10-CM | POA: Diagnosis not present

## 2017-01-29 DIAGNOSIS — K219 Gastro-esophageal reflux disease without esophagitis: Secondary | ICD-10-CM | POA: Diagnosis not present

## 2017-04-17 DIAGNOSIS — J301 Allergic rhinitis due to pollen: Secondary | ICD-10-CM | POA: Diagnosis not present

## 2017-04-17 DIAGNOSIS — Z6822 Body mass index (BMI) 22.0-22.9, adult: Secondary | ICD-10-CM | POA: Diagnosis not present

## 2017-04-17 DIAGNOSIS — J019 Acute sinusitis, unspecified: Secondary | ICD-10-CM | POA: Diagnosis not present

## 2017-06-18 DIAGNOSIS — Z6823 Body mass index (BMI) 23.0-23.9, adult: Secondary | ICD-10-CM | POA: Diagnosis not present

## 2017-06-18 DIAGNOSIS — J683 Other acute and subacute respiratory conditions due to chemicals, gases, fumes and vapors: Secondary | ICD-10-CM | POA: Diagnosis not present

## 2017-06-18 DIAGNOSIS — J22 Unspecified acute lower respiratory infection: Secondary | ICD-10-CM | POA: Diagnosis not present

## 2017-09-24 DIAGNOSIS — L089 Local infection of the skin and subcutaneous tissue, unspecified: Secondary | ICD-10-CM | POA: Diagnosis not present

## 2017-09-24 DIAGNOSIS — S40861A Insect bite (nonvenomous) of right upper arm, initial encounter: Secondary | ICD-10-CM | POA: Diagnosis not present

## 2017-09-24 DIAGNOSIS — W57XXXA Bitten or stung by nonvenomous insect and other nonvenomous arthropods, initial encounter: Secondary | ICD-10-CM | POA: Diagnosis not present

## 2021-01-10 ENCOUNTER — Other Ambulatory Visit: Payer: Self-pay

## 2021-01-10 ENCOUNTER — Ambulatory Visit
Admission: RE | Admit: 2021-01-10 | Discharge: 2021-01-10 | Disposition: A | Discharge status: Home / Self Care | Payer: 59 | Source: Ambulatory Visit | Attending: Student | Admitting: Student

## 2021-01-10 VITALS — BP 139/85 | HR 98 | Temp 98.4°F | Resp 18

## 2021-01-10 DIAGNOSIS — J0100 Acute maxillary sinusitis, unspecified: Secondary | ICD-10-CM

## 2021-01-10 MED ORDER — AMOXICILLIN 875 MG PO TABS: 875.0000 mg | ORAL_TABLET | Freq: Two times a day (BID) | ORAL | 0 refills | Status: AC

## 2021-01-10 NOTE — ED Provider Notes (Signed)
[facility]    CSN: [id number] Arrival date & time: 01/10/21  1633      History   Chief Complaint Chief Complaint  Patient presents with   Nasal Congestion    HPI Henry Peck is a 40 y.o. male presenting with congestion facial pressure and R ear pressure x 2 weeks. Medical history noncontributory. Denies recent URI or allergic rhinits. Denies fevers/chills, cough.   HPI  Past Medical History:  Diagnosis Date   Complication of anesthesia    "extra thirsty-a little upset"   Headache    History of kidney stones    possible one stone   Inguinal hernia    bilateral    Patient Active Problem List   Diagnosis Date Noted   TOBACCO DEPENDENCE 05/24/2006    Past Surgical History:  Procedure Laterality Date   FRACTURE SURGERY Right ~18 years ago   hand, removed knuckle   INGUINAL HERNIA REPAIR Bilateral 08/14/2014   Procedure: LAPAROSCOPIC BILATERAL INGUINAL HERNIA REPAIR;  Surgeon: [name], MD;  Location: [facility];  Service: General;  Laterality: Bilateral;   INSERTION OF MESH Bilateral 08/14/2014   Procedure: INSERTION OF MESH;  Surgeon: [name], MD;  Location: [facility];  Service: General;  Laterality: Bilateral;   WISDOM TOOTH EXTRACTION         Home Medications    Prior to Admission medications   Medication Sig Start Date End Date Taking? Authorizing Provider  amoxicillin (AMOXIL) 875 MG tablet Take 1 tablet (875 mg total) by mouth 2 (two) times daily for 7 days. 01/10/21 01/17/21 Yes Rhys Martini, PA-C  acetaminophen (TYLENOL) 500 MG tablet Take 500 mg by mouth every 6 (six) hours as needed (Pain).    [provider]  ibuprofen (ADVIL,MOTRIN) 200 MG tablet Take 400 mg by mouth every 6 (six) hours as needed (Pain).    [provider]  oxyCODONE-acetaminophen (ROXICET) 5-325 MG per tablet Take 1-2 tablets by mouth every 4 (four) hours as needed. 08/14/14   [name], MD    Family History History reviewed. No  pertinent family history.  Social History Social History   Tobacco Use   Smoking status: Every Day    Packs/day: 1.00    Years: 20.00    Pack years: 20.00    Types: Cigarettes   Smokeless tobacco: Never  Substance Use Topics   Alcohol use: Yes    Comment: rare   Drug use: Yes    Types: Marijuana    Comment: last use 18 years ago     Allergies   Codeine   Review of Systems Review of Systems  Constitutional:  Negative for appetite change, chills and fever.  HENT:  Positive for sinus pressure. Negative for congestion, ear pain, rhinorrhea, sinus pain and sore throat.   Eyes:  Negative for redness and visual disturbance.  Respiratory:  Negative for cough, chest tightness, shortness of breath and wheezing.   Cardiovascular:  Negative for chest pain and palpitations.  Gastrointestinal:  Negative for abdominal pain, constipation, diarrhea, nausea and vomiting.  Genitourinary:  Negative for dysuria, frequency and urgency.  Musculoskeletal:  Negative for myalgias.  Neurological:  Negative for dizziness, weakness and headaches.  Psychiatric/Behavioral:  Negative for confusion.   All other systems reviewed and are negative.   Physical Exam Triage Vital Signs ED Triage Vitals  Enc Vitals Group     BP 01/10/21 1737 139/85     Pulse Rate 01/10/21 1737 98     Resp 01/10/21 1737  18     Temp 01/10/21 1737 98.4 F (36.9 C)     Temp src --      SpO2 01/10/21 1737 98 %     Weight --      Height --      Head Circumference --      Peak Flow --      Pain Score 01/10/21 1736 0     Pain Loc --      Pain Edu? --      Excl. in [place]? --    No data found.  Updated Vital Signs BP 139/85   Pulse 98   Temp 98.4 F (36.9 C)   Resp 18   SpO2 98%   Visual Acuity Right Eye Distance:   Left Eye Distance:   Bilateral Distance:    Right Eye Near:   Left Eye Near:    Bilateral Near:     Physical Exam Vitals reviewed.  Constitutional:      General: He is not in acute  distress.    Appearance: Normal appearance. He is not ill-appearing.  HENT:     Head: Normocephalic and atraumatic.     Right Ear: Tympanic membrane, ear canal and external ear normal. No tenderness. No middle ear effusion. There is no impacted cerumen. Tympanic membrane is not perforated, erythematous, retracted or bulging.     Left Ear: Tympanic membrane, ear canal and external ear normal. No tenderness.  No middle ear effusion. There is no impacted cerumen. Tympanic membrane is not perforated, erythematous, retracted or bulging.     Nose: No congestion.     Right Sinus: Maxillary sinus tenderness present.     Left Sinus: Maxillary sinus tenderness present.     Mouth/Throat:     Mouth: Mucous membranes are moist.     Pharynx: Uvula midline. No oropharyngeal exudate or posterior oropharyngeal erythema.  Eyes:     Extraocular Movements: Extraocular movements intact.     Pupils: Pupils are equal, round, and reactive to light.  Cardiovascular:     Rate and Rhythm: Normal rate and regular rhythm.     Heart sounds: Normal heart sounds.  Pulmonary:     Effort: Pulmonary effort is normal.     Breath sounds: Normal breath sounds. No decreased breath sounds, wheezing, rhonchi or rales.  Abdominal:     Palpations: Abdomen is soft.     Tenderness: There is no abdominal tenderness. There is no guarding or rebound.  Neurological:     General: No focal deficit present.     Mental Status: He is alert and oriented to person, place, and time.  Psychiatric:        Mood and Affect: Mood normal.        Behavior: Behavior normal.        Thought Content: Thought content normal.        Judgment: Judgment normal.     UC Treatments / Results  Labs (all labs ordered are listed, but only abnormal results are displayed) Labs Reviewed - No data to display  EKG   Radiology No results found.  Procedures Procedures (including critical care time)  Medications Ordered in UC Medications - No data to  display  Initial Impression / Assessment and Plan / UC Course  I have reviewed the triage vital signs and the nursing notes.  Pertinent labs & imaging results that were available during my care of the patient were reviewed by me and considered in my medical decision making (see  chart for details).     This patient is a very pleasant 40 y.o. year old male presenting with maxillary sinusitis. Today this pt is afebrile nontachycardic nontachypneic, oxygenating well on room air, no wheezes rhonchi or rales. Denies recent URI or allergic rhinitis.   Amoxicillin as below.   ED return precautions discussed. Patient verbalizes understanding and agreement.  .   Final Clinical Impressions(s) / UC Diagnoses   Final diagnoses:  Acute non-recurrent maxillary sinusitis     Discharge Instructions      -Amoxicillin twice daily x7 days     ED Prescriptions     Medication Sig Dispense Auth. Provider   amoxicillin (AMOXIL) 875 MG tablet Take 1 tablet (875 mg total) by mouth 2 (two) times daily for 7 days. 14 tablet Rhys Martini, PA-C      PDMP not reviewed this encounter.   Rhys Martini, PA-C 01/10/21 [phone]

## 2021-01-10 NOTE — ED Triage Notes (Signed)
Pt presents with nasal congestion and right ear pressure for almost 2 weeks

## 2021-01-10 NOTE — Discharge Instructions (Addendum)
-  Amoxicillin twice daily x7 days ° °

## 2022-04-28 ENCOUNTER — Emergency Department (HOSPITAL_COMMUNITY)
Admission: EM | Admit: 2022-04-28 | Discharge: 2022-04-29 | Disposition: A | Discharge status: Home / Self Care | Payer: 59 | Attending: Emergency Medicine | Admitting: Emergency Medicine

## 2022-04-28 ENCOUNTER — Other Ambulatory Visit: Payer: Self-pay

## 2022-04-28 DIAGNOSIS — Z1152 Encounter for screening for COVID-19: Secondary | ICD-10-CM | POA: Diagnosis not present

## 2022-04-28 DIAGNOSIS — K029 Dental caries, unspecified: Secondary | ICD-10-CM | POA: Insufficient documentation

## 2022-04-28 DIAGNOSIS — R6883 Chills (without fever): Secondary | ICD-10-CM | POA: Diagnosis present

## 2022-04-28 NOTE — ED Triage Notes (Signed)
Pt via POV c/o recurring odd flavor in his mouth with neck and muscle pains x 2 months. Today he woke up and felt nauseated and began sweating profusely followed by chills. NAD in triage. No pain at present and pt states his only symptom at time of triage is the odd flavor in his mouth.

## 2022-04-29 LAB — BASIC METABOLIC PANEL
Anion gap: 12 (ref 5–15)
BUN: 12 mg/dL (ref 6–20)
CO2: 24 mmol/L (ref 22–32)
Calcium: 9.6 mg/dL (ref 8.9–10.3)
Chloride: 102 mmol/L (ref 98–111)
Creatinine, Ser: 0.95 mg/dL (ref 0.61–1.24)
GFR, Estimated: >60 mL/min (ref >60)
Glucose, Bld: 104 mg/dL — ABNORMAL HIGH (ref 70–99)
Potassium: 4.1 mmol/L (ref 3.5–5.1)
Sodium: 138 mmol/L (ref 135–145)

## 2022-04-29 LAB — CBC WITH DIFFERENTIAL/PLATELET
Abs Immature Granulocytes: 0.03 10*3/uL (ref 0.00–0.07)
Basophils Absolute: 0 10*3/uL (ref 0.0–0.1)
Basophils Relative: 1 %
Eosinophils Absolute: 0 10*3/uL (ref 0.0–0.5)
Eosinophils Relative: 0 %
HCT: 45.9 % (ref 39.0–52.0)
Hemoglobin: 15.9 g/dL (ref 13.0–17.0)
Immature Granulocytes: 0 %
Lymphocytes Relative: 23 %
Lymphs Abs: 1.8 10*3/uL (ref 0.7–4.0)
MCH: 32.1 pg (ref 26.0–34.0)
MCHC: 34.6 g/dL (ref 30.0–36.0)
MCV: 92.5 fL (ref 80.0–100.0)
Monocytes Absolute: 0.5 10*3/uL (ref 0.1–1.0)
Monocytes Relative: 7 %
Neutro Abs: 5.5 10*3/uL (ref 1.7–7.7)
Neutrophils Relative %: 69 %
Platelets: 424 10*3/uL — ABNORMAL HIGH (ref 150–400)
RBC: 4.96 MIL/uL (ref 4.22–5.81)
RDW: 12.8 % (ref 11.5–15.5)
WBC: 7.9 10*3/uL (ref 4.0–10.5)
nRBC: 0 % (ref 0.0–0.2)

## 2022-04-29 LAB — RESP PANEL BY RT-PCR (RSV, FLU A&B, COVID)  RVPGX2
Influenza A by PCR: NEGATIVE
Influenza B by PCR: NEGATIVE
Resp Syncytial Virus by PCR: NEGATIVE
SARS Coronavirus 2 by RT PCR: NEGATIVE

## 2022-04-29 LAB — LACTIC ACID, PLASMA: Lactic Acid, Venous: 1.1 mmol/L (ref 0.5–1.9)

## 2022-04-29 MED ORDER — AMOXICILLIN 500 MG PO CAPS: 500.0000 mg | ORAL_CAPSULE | Freq: Three times a day (TID) | ORAL | 0 refills | Status: DC

## 2022-04-29 NOTE — ED Provider Notes (Signed)
[facility] Provider Note   CSN: [id number] Arrival date & time: 04/28/22  2048     History  Chief Complaint  Patient presents with   Chills    Henry Peck is a 42 y.o. male.  Patient reports that he has been experiencing a foul taste in his mouth for a couple of months.  He saw his primary care doctor for this, had blood work and no cause was found.  Patient reports that this has persisted ever since and then today he had onset of chills, sweats thinks it is related to the taste in his mouth.       Home Medications Prior to Admission medications   Medication Sig Start Date End Date Taking? Authorizing Provider  amoxicillin (AMOXIL) 500 MG capsule Take 1 capsule (500 mg total) by mouth 3 (three) times daily. 04/29/22  Yes Xyla Leisner, Gwenyth Allegra, MD  acetaminophen (TYLENOL) 500 MG tablet Take 500 mg by mouth every 6 (six) hours as needed (Pain).    [provider]  ibuprofen (ADVIL,MOTRIN) 200 MG tablet Take 400 mg by mouth every 6 (six) hours as needed (Pain).    [provider]  oxyCODONE-acetaminophen (ROXICET) 5-325 MG per tablet Take 1-2 tablets by mouth every 4 (four) hours as needed. 08/14/14   Ralene Ok, MD      Allergies    Codeine    Review of Systems   Review of Systems  Physical Exam Updated Vital Signs BP 122/78   Pulse 77   Temp 97.7 F (36.5 C) (Oral)   Resp 16   Ht 6' (1.829 m)   Wt 83.9 kg   SpO2 98%   BMI 25.09 kg/m  Physical Exam Vitals and nursing note reviewed.  Constitutional:      General: He is not in acute distress.    Appearance: He is well-developed.  HENT:     Head: Normocephalic and atraumatic.     Mouth/Throat:     Mouth: Mucous membranes are moist.     Dentition: Abnormal dentition.  Eyes:     General: Vision grossly intact. Gaze aligned appropriately.     Extraocular Movements: Extraocular movements intact.     Conjunctiva/sclera: Conjunctivae normal.   Cardiovascular:     Rate and Rhythm: Normal rate and regular rhythm.     Pulses: Normal pulses.     Heart sounds: Normal heart sounds, S1 normal and S2 normal. No murmur heard.    No friction rub. No gallop.  Pulmonary:     Effort: Pulmonary effort is normal. No respiratory distress.     Breath sounds: Normal breath sounds.  Abdominal:     Palpations: Abdomen is soft.     Tenderness: There is no abdominal tenderness. There is no guarding or rebound.     Hernia: No hernia is present.  Musculoskeletal:        General: No swelling.     Cervical back: Full passive range of motion without pain, normal range of motion and neck supple. No pain with movement, spinous process tenderness or muscular tenderness. Normal range of motion.     Right lower leg: No edema.     Left lower leg: No edema.  Skin:    General: Skin is warm and dry.     Capillary Refill: Capillary refill takes less than 2 seconds.     Findings: No ecchymosis, erythema, lesion or wound.  Neurological:     Mental Status: He is alert  and oriented to person, place, and time.     GCS: GCS eye subscore is 4. GCS verbal subscore is 5. GCS motor subscore is 6.     Cranial Nerves: Cranial nerves 2-12 are intact.     Sensory: Sensation is intact.     Motor: Motor function is intact. No weakness or abnormal muscle tone.     Coordination: Coordination is intact.  Psychiatric:        Mood and Affect: Mood normal.        Speech: Speech normal.        Behavior: Behavior normal.     ED Results / Procedures / Treatments   Labs (all labs ordered are listed, but only abnormal results are displayed) Labs Reviewed  CBC WITH DIFFERENTIAL/PLATELET - Abnormal; Notable for the following components:      Result Value   Platelets 424 (*)    All other components within normal limits  BASIC METABOLIC PANEL - Abnormal; Notable for the following components:   Glucose, Bld 104 (*)    All other components within normal limits  RESP PANEL BY  RT-PCR (RSV, FLU A&B, COVID)  RVPGX2  LACTIC ACID, PLASMA    EKG None  Radiology No results found.  Procedures Procedures    Medications Ordered in ED Medications - No data to display  ED Course/ Medical Decision Making/ A&P                             Medical Decision Making Amount and/or Complexity of Data Reviewed Labs: ordered.   Patient presents to the emerged department because he had an episode today where he had sudden onset flushing, sweats followed by chills.  He did not take his temperature.  Patient concerned because he has been experiencing a bad taste in his mouth for a couple of months.  He saw his doctor for this and was told that there was no cause found.  Patient appears well upon examination.  He is afebrile with normal vital signs.  Oropharyngeal examination reveals fairly significant dental abnormalities.  No obvious large or draining abscesses seen.  Blood work is unremarkable.  No white blood cell count.  Normal lactic acid.  COVID, influenza, RSV negative.  Will treat with empirically with antibiotics with dental coverage, refer to dentistry.        Final Clinical Impression(s) / ED Diagnoses Final diagnoses:  Chills  Dental caries    Rx / DC Orders ED Discharge Orders          Ordered    amoxicillin (AMOXIL) 500 MG capsule  3 times daily        04/29/22 0256              Orpah Greek, MD 04/29/22 [phone]

## 2022-06-14 ENCOUNTER — Ambulatory Visit
Admission: EM | Admit: 2022-06-14 | Discharge: 2022-06-14 | Disposition: A | Discharge status: Home / Self Care | Payer: 59 | Attending: Nurse Practitioner | Admitting: Nurse Practitioner

## 2022-06-14 ENCOUNTER — Encounter: Payer: Self-pay | Admitting: Emergency Medicine

## 2022-06-14 ENCOUNTER — Other Ambulatory Visit: Payer: Self-pay

## 2022-06-14 DIAGNOSIS — M791 Myalgia, unspecified site: Secondary | ICD-10-CM

## 2022-06-14 DIAGNOSIS — B370 Candidal stomatitis: Secondary | ICD-10-CM | POA: Diagnosis not present

## 2022-06-14 MED ORDER — NYSTATIN 100000 UNIT/ML MT SUSP: 5.0000 mL | Freq: Four times a day (QID) | OROMUCOSAL | 0 refills | Status: AC

## 2022-06-14 NOTE — ED Provider Notes (Signed)
[facility]    CSN: [id number] Arrival date & time: 06/14/22  0802      History   Chief Complaint Chief Complaint  Patient presents with   Muscle Pain    HPI Henry Peck is a 41 y.o. male.   The history is provided by the patient.   The patient presents for complaints of a white coating on his tongue and a bad taste in his mouth that has been present for the past several months.  He states symptoms have been accompanied by body aches and muscle soreness.  Patient denies fever, chills, headache, sore throat, dental pain, chest pain, abdominal pain, nausea, vomiting, or diarrhea.  Patient states that he has been seen by both his PCP and the emergency department for the same or similar symptoms.  He states lab work has been completed, all results have been normal.  He states that his PCP started him on antidepressants because he told him this was in his mind, patient states he took them for 3 days and then stopped them.  He is concerned because he states his family does have a history of thyroid disease.  Patient denies history of immunosuppression.  Past Medical History:  Diagnosis Date   Complication of anesthesia    "extra thirsty-a little upset"   Headache    History of kidney stones    possible one stone   Inguinal hernia    bilateral    Patient Active Problem List   Diagnosis Date Noted   TOBACCO DEPENDENCE 05/24/2006    Past Surgical History:  Procedure Laterality Date   FRACTURE SURGERY Right ~18 years ago   hand, removed knuckle   INGUINAL HERNIA REPAIR Bilateral 08/14/2014   Procedure: LAPAROSCOPIC BILATERAL INGUINAL HERNIA REPAIR;  Surgeon: Ralene Ok, MD;  Location: [facility];  Service: General;  Laterality: Bilateral;   INSERTION OF MESH Bilateral 08/14/2014   Procedure: INSERTION OF MESH;  Surgeon: Ralene Ok, MD;  Location: [facility];  Service: General;  Laterality: Bilateral;   [facility] Medications     Prior to Admission medications   Medication Sig Start Date End Date Taking? Authorizing Provider  nystatin (MYCOSTATIN) 100000 UNIT/ML suspension Take 5 mLs (500,000 Units total) by mouth 4 (four) times daily for 10 days. Swish and swallow 5 mL 4 times daily for the next 10 days. 06/14/22 06/24/22 Yes Marielena Harvell-Warren, Alda Lea, NP  acetaminophen (TYLENOL) 500 MG tablet Take 500 mg by mouth every 6 (six) hours as needed (Pain).    [provider]  amoxicillin (AMOXIL) 500 MG capsule Take 1 capsule (500 mg total) by mouth 3 (three) times daily. 04/29/22   Orpah Greek, MD  ibuprofen (ADVIL,MOTRIN) 200 MG tablet Take 400 mg by mouth every 6 (six) hours as needed (Pain).    [provider]  oxyCODONE-acetaminophen (ROXICET) 5-325 MG per tablet Take 1-2 tablets by mouth every 4 (four) hours as needed. 08/14/14   Ralene Ok, MD    Family History History reviewed. No pertinent family history.  Social History Social History   Tobacco Use   Smoking status: Every Day    Packs/day: 1.00    Years: 20.00    Additional pack years: 0.00    Total pack years: 20.00    Types: Cigarettes   Smokeless tobacco: Never  Substance Use Topics   Alcohol use: Yes    Comment: rare   Drug use: Yes  Types: Marijuana    Comment: last use 18 years ago     Allergies   Codeine   Review of Systems Review of Systems Per HPI  Physical Exam Triage Vital Signs ED Triage Vitals  Enc Vitals Group     BP 06/14/22 0820 (!) 144/83     Pulse Rate 06/14/22 0820 (!) 104     Resp 06/14/22 0820 17     Temp 06/14/22 0820 98.4 F (36.9 C)     Temp Source 06/14/22 0820 Oral     SpO2 06/14/22 0820 98 %     Weight --      Height --      Head Circumference --      Peak Flow --      Pain Score 06/14/22 0824 2     Pain Loc --      Pain Edu? --      Excl. in [place]? --    No data found.  Updated Vital Signs BP (!) 144/83 (BP Location: Right Arm)   Pulse (!) 104   Temp 98.4 F  (36.9 C) (Oral)   Resp 17   SpO2 98%   Visual Acuity Right Eye Distance:   Left Eye Distance:   Bilateral Distance:    Right Eye Near:   Left Eye Near:    Bilateral Near:     Physical Exam Vitals and nursing note reviewed.  Constitutional:      General: He is not in acute distress.    Appearance: Normal appearance.  HENT:     Head: Normocephalic.     Right Ear: Tympanic membrane, ear canal and external ear normal.     Left Ear: Tympanic membrane, ear canal and external ear normal.     Nose: Nose normal.     Mouth/Throat:     Mouth: Mucous membranes are moist.     Pharynx: Oropharynx is clear. Uvula midline.     Comments: White coating noted to tongue and in the bilateral buccal mucosa.  There are no open lesions, dental abnormalities, gum lesions, or gingival swelling. Eyes:     Extraocular Movements: Extraocular movements intact.     Conjunctiva/sclera: Conjunctivae normal.     Pupils: Pupils are equal, round, and reactive to light.  Cardiovascular:     Rate and Rhythm: Normal rate and regular rhythm.     Pulses: Normal pulses.     Heart sounds: Normal heart sounds.  Pulmonary:     Effort: Pulmonary effort is normal.     Breath sounds: Normal breath sounds.  Abdominal:     General: Bowel sounds are normal.     Palpations: Abdomen is soft.     Tenderness: There is no abdominal tenderness.  Musculoskeletal:     Cervical back: Normal range of motion.  Skin:    General: Skin is warm and dry.  Neurological:     General: No focal deficit present.     Mental Status: He is alert and oriented to person, place, and time.  Psychiatric:        Mood and Affect: Mood normal.        Behavior: Behavior normal.      UC Treatments / Results  Labs (all labs ordered are listed, but only abnormal results are displayed) Labs Reviewed - No data to display  EKG   Radiology No results found.  Procedures Procedures (including critical care time)  Medications Ordered in  UC Medications - No data to display  Initial  Impression / Assessment and Plan / UC Course  I have reviewed the triage vital signs and the nursing notes.  Pertinent labs & imaging results that were available during my care of the patient were reviewed by me and considered in my medical decision making (see chart for details).  The patient is well-appearing, he is in no acute distress, vital signs are stable.  Patient presents for complaints of a "bad taste in his mouth" and a "white coating on his tongue".  Patient does not have a history of immunosuppression.  Symptoms appear to be consistent with thrush, will start patient on nystatin 100,000 units suspension.  For the patient's muscle soreness and bodyaches, advised patient to increase fluids, and over-the-counter analgesics as needed.  Supportive care recommendations were also provided to the patient.  Patient was given information so he can establish care with a new PCP.  Patient was in agreement with this plan of care and verbalizes understanding.  All questions were answered.  Patient stable for discharge.   Final Clinical Impressions(s) / UC Diagnoses   Final diagnoses:  Oral thrush  Muscle soreness     Discharge Instructions      Take medication as prescribed. Recommend a softer diet while symptoms persist. It may be helpful to eat yogurt while symptoms persist. May take over-the-counter medications such as Tylenol or ibuprofen as needed for pain, fever, or general discomfort. As discussed, I am providing you with a list of primary care physicians in this area who are excepting patients.  Please call and follow-up as soon as possible if symptoms continue to persist. Follow-up as needed.     ED Prescriptions     Medication Sig Dispense Auth. Provider   nystatin (MYCOSTATIN) 100000 UNIT/ML suspension Take 5 mLs (500,000 Units total) by mouth 4 (four) times daily for 10 days. Swish and swallow 5 mL 4 times daily for the next  10 days. 140 mL Jayson Waterhouse-Warren, Alda Lea, NP      PDMP not reviewed this encounter.   Tish Men, NP 06/15/22 [phone]

## 2022-06-14 NOTE — Discharge Instructions (Signed)
Take medication as prescribed. Recommend a softer diet while symptoms persist. It may be helpful to eat yogurt while symptoms persist. May take over-the-counter medications such as Tylenol or ibuprofen as needed for pain, fever, or general discomfort. As discussed, I am providing you with a list of primary care physicians in this area who are excepting patients.  Please call and follow-up as soon as possible if symptoms continue to persist. Follow-up as needed.

## 2022-06-14 NOTE — ED Triage Notes (Signed)
Pt reports muscle aches, "white coating" in mouth, bad taste in mouth since November. Pt reports has been seen by PCP and ED and reports "no one seems to know what's going on." Pt reports "I have thyroid problems that run in my family."

## 2022-07-26 ENCOUNTER — Emergency Department (HOSPITAL_COMMUNITY): Payer: 59

## 2022-07-26 ENCOUNTER — Other Ambulatory Visit: Payer: Self-pay

## 2022-07-26 ENCOUNTER — Emergency Department (HOSPITAL_COMMUNITY)
Admission: EM | Admit: 2022-07-26 | Discharge: 2022-07-26 | Disposition: A | Discharge status: Home / Self Care | Payer: 59 | Attending: Emergency Medicine | Admitting: Emergency Medicine

## 2022-07-26 DIAGNOSIS — K2100 Gastro-esophageal reflux disease with esophagitis, without bleeding: Secondary | ICD-10-CM

## 2022-07-26 DIAGNOSIS — R079 Chest pain, unspecified: Secondary | ICD-10-CM

## 2022-07-26 DIAGNOSIS — F17210 Nicotine dependence, cigarettes, uncomplicated: Secondary | ICD-10-CM | POA: Diagnosis not present

## 2022-07-26 LAB — CBC
HCT: 43.4 % (ref 39.0–52.0)
Hemoglobin: 15.4 g/dL (ref 13.0–17.0)
MCH: 33 pg (ref 26.0–34.0)
MCHC: 35.5 g/dL (ref 30.0–36.0)
MCV: 92.9 fL (ref 80.0–100.0)
Platelets: 334 10*3/uL (ref 150–400)
RBC: 4.67 MIL/uL (ref 4.22–5.81)
RDW: 13.1 % (ref 11.5–15.5)
WBC: 5.7 10*3/uL (ref 4.0–10.5)
nRBC: 0 % (ref 0.0–0.2)

## 2022-07-26 LAB — BASIC METABOLIC PANEL
Anion gap: 13 (ref 5–15)
BUN: 11 mg/dL (ref 6–20)
CO2: 22 mmol/L (ref 22–32)
Calcium: 9.6 mg/dL (ref 8.9–10.3)
Chloride: 103 mmol/L (ref 98–111)
Creatinine, Ser: 0.9 mg/dL (ref 0.61–1.24)
GFR, Estimated: >60 mL/min (ref >60)
Glucose, Bld: 114 mg/dL — ABNORMAL HIGH (ref 70–99)
Potassium: 3.8 mmol/L (ref 3.5–5.1)
Sodium: 138 mmol/L (ref 135–145)

## 2022-07-26 LAB — TROPONIN I (HIGH SENSITIVITY)
Troponin I (High Sensitivity): <2 ng/L (ref <18)
Troponin I (High Sensitivity): <2 ng/L (ref <18)

## 2022-07-26 LAB — TSH: TSH: 1.79 u[IU]/mL (ref 0.350–4.500)

## 2022-07-26 LAB — LIPASE, BLOOD: Lipase: 27 U/L (ref 11–51)

## 2022-07-26 MED ORDER — LIDOCAINE VISCOUS HCL 2 % MT SOLN
15.0000 mL | Freq: Once | OROMUCOSAL | Status: AC
Start: 2022-07-26 — End: 2022-07-26
  Administered 2022-07-26: 15 mL via ORAL
  Filled 2022-07-26: qty 15

## 2022-07-26 MED ORDER — FAMOTIDINE IN NACL 20-0.9 MG/50ML-% IV SOLN
20.0000 mg | Freq: Once | INTRAVENOUS | Status: AC
  Administered 2022-07-26: 20 mg via INTRAVENOUS
  Filled 2022-07-26: qty 50

## 2022-07-26 MED ORDER — ALUM & MAG HYDROXIDE-SIMETH 200-200-20 MG/5ML PO SUSP
30.0000 mL | Freq: Once | ORAL | Status: AC
  Administered 2022-07-26: 30 mL via ORAL
  Filled 2022-07-26: qty 30

## 2022-07-26 NOTE — Discharge Instructions (Signed)
You were seen for your chest discomfort in the emergency department.  It is likely from reflux.  At home, please take the antacids you were prescribed.    Follow-up with your primary doctor in 2-3 days regarding your visit.    Return immediately to the emergency department if you experience any of the following: Worsening pain, sweating, vomiting, or any other concerning symptoms.    Thank you for visiting our Emergency Department. It was a pleasure taking care of you today.

## 2022-07-26 NOTE — ED Provider Notes (Signed)
[place] EMERGENCY DEPARTMENT AT [facility] Provider Note   CSN: [id number] Arrival date & time: 07/26/22  1324     History  Chief Complaint  Patient presents with   Chest Pain    Henry Peck is a 42 y.o. male.  42 year old male with a history of alcohol use and tobacco use who presents emergency department chest pain.  Reports that over the past few months has had intermittent chest pain with a strange sensation in his mouth.  Says that this morning at 8 AM he woke up and had a chest pressure and reports that it may have radiated to his left arm and left neck.  Not exertional.  Unsure of diaphoresis.  No vomiting.  No personal history of MI or stents.  No family history of MI.  Says that he still smokes cigarettes and drinks alcohol.  No other recreational drug use.  Denies any shortness of breath or leg swelling recently.  Has a family history of thyroid cancer and occasionally gets neck discomfort so is requesting the TSH be sent today as well.       Home Medications Prior to Admission medications   Medication Sig Start Date End Date Taking? Authorizing Provider  acetaminophen (TYLENOL) 500 MG tablet Take 500 mg by mouth every 6 (six) hours as needed (Pain). Patient not taking: Reported on 07/26/2022    [provider]  amoxicillin (AMOXIL) 500 MG capsule Take 1 capsule (500 mg total) by mouth 3 (three) times daily. Patient not taking: Reported on 07/26/2022 04/29/22   Gilda Crease, MD  ibuprofen (ADVIL,MOTRIN) 200 MG tablet Take 400 mg by mouth every 6 (six) hours as needed (Pain). Patient not taking: Reported on 07/26/2022    [provider]  oxyCODONE-acetaminophen (ROXICET) 5-325 MG per tablet Take 1-2 tablets by mouth every 4 (four) hours as needed. Patient not taking: Reported on 07/26/2022 08/14/14   [name], MD      Allergies    Codeine and Duloxetine    Review of Systems   Review of Systems  Physical Exam Updated Vital  Signs BP 128/85   Pulse 80   Temp 98.4 F (36.9 C) (Oral)   Resp 20   Ht 6' (1.829 m)   Wt 86.2 kg   SpO2 100%   BMI 25.77 kg/m  Physical Exam Vitals and nursing note reviewed.  Constitutional:      General: He is not in acute distress.    Appearance: He is well-developed.  HENT:     Head: Normocephalic and atraumatic.     Right Ear: External ear normal.     Left Ear: External ear normal.     Nose: Nose normal.  Eyes:     Extraocular Movements: Extraocular movements intact.     Conjunctiva/sclera: Conjunctivae normal.     Pupils: Pupils are equal, round, and reactive to light.  Neck:     Comments: No thyromegaly or goiter noted Cardiovascular:     Rate and Rhythm: Normal rate and regular rhythm.     Heart sounds: Normal heart sounds.     Comments: Radial pulses 2+ bilaterally Pulmonary:     Effort: Pulmonary effort is normal. No respiratory distress.     Breath sounds: Normal breath sounds.  Abdominal:     Palpations: Abdomen is soft.  Musculoskeletal:     Cervical back: Normal range of motion and neck supple.     Right lower leg: No edema.     Left  lower leg: No edema.  Skin:    General: Skin is warm and dry.  Neurological:     Mental Status: He is alert. Mental status is at baseline.  Psychiatric:        Mood and Affect: Mood normal.        Behavior: Behavior normal.     ED Results / Procedures / Treatments   Labs (all labs ordered are listed, but only abnormal results are displayed) Labs Reviewed  BASIC METABOLIC PANEL - Abnormal; Notable for the following components:      Result Value   Glucose, Bld 114 (*)    All other components within normal limits  CBC  TSH  LIPASE, BLOOD  TROPONIN I (HIGH SENSITIVITY)  TROPONIN I (HIGH SENSITIVITY)    EKG EKG Interpretation  Date/Time:  Wednesday Jul 26 2022 09:50:09 EDT Ventricular Rate:  88 PR Interval:  125 QRS Duration: 96 QT Interval:  364 QTC Calculation: 441 R Axis:   39 Text  Interpretation: Sinus rhythm Confirmed by Vonita Moss [phone]) on 07/26/2022 9:57:33 AM  Radiology DG Chest Portable 1 View  Result Date: 07/26/2022 CLINICAL DATA:  Chest pain EXAM: PORTABLE CHEST 1 VIEW COMPARISON:  Portable exam 1024 hours without priors for comparison FINDINGS: Normal heart size, mediastinal contours, and pulmonary vascularity. Lungs clear. No pulmonary infiltrate, pleural effusion, or pneumothorax. Osseous structures unremarkable. IMPRESSION: No acute abnormalities. Electronically Signed   By: Ulyses Southward M.D.   On: 07/26/2022 10:50    Procedures Procedures   Medications Ordered in ED Medications  famotidine (PEPCID) IVPB 20 mg premix (0 mg Intravenous Stopped 07/26/22 1044)  alum & mag hydroxide-simeth (MAALOX/MYLANTA) 200-200-20 MG/5ML suspension 30 mL (30 mLs Oral Given 07/26/22 1009)    And  lidocaine (XYLOCAINE) 2 % viscous mouth solution 15 mL (15 mLs Oral Given 07/26/22 1008)    ED Course/ Medical Decision Making/ A&P             HEART Score: 2                Medical Decision Making Amount and/or Complexity of Data Reviewed Labs: ordered. Radiology: ordered.  Risk OTC drugs. Prescription drug management.   Henry Peck is a 42 y.o. male with comorbidities that complicate the patient evaluation including alcohol use, tobacco use, and family history of thyroid disorder who presents to the emergency department chest pain, strange taste in his mouth, and occasional neck pain  Initial Ddx:  MI, reflux, gastritis, pancreatitis, thyroid disorder  MDM:  The patient likely is having reflux based on his symptoms and the strange taste in his mouth.  Will give him a GI cocktail.  Will check for MI today as well given the fact that he smokes and had radiation of his pain down his arm and neck.  With his alcohol use and substernal pain will check a lipase as well.  With his neck discomfort and family history we will check a TSH also.  Plan:   Labs TSH Troponin EKG Chest x-ray GI cocktail  ED Summary/Re-evaluation:  Patient underwent the above workup which was reassuring.  No signs of ischemia.  TSH was WNL.  Was feeling better after the GI cocktail.  Feel that the symptoms are likely due to GERD.  Heart score of 2.  Will have him follow-up with his primary doctor in several days.  This patient presents to the ED for concern of complaints listed in HPI, this involves an extensive number of treatment options, and  is a complaint that carries with it a high risk of complications and morbidity. Disposition including potential need for admission considered.   Dispo: DC Home. Return precautions discussed including, but not limited to, those listed in the AVS. Allowed pt time to ask questions which were answered fully prior to dc.  Records reviewed Outpatient Clinic Notes The following labs were independently interpreted: Serial Troponins and show no acute abnormality I independently reviewed the following imaging with scope of interpretation limited to determining acute life threatening conditions related to emergency care: Chest x-ray and agree with the radiologist interpretation with the following exceptions: none I personally reviewed and interpreted cardiac monitoring: normal sinus rhythm  I personally reviewed and interpreted the pt's EKG: see above for interpretation  I have reviewed the patients home medications and made adjustments as needed  Final Clinical Impression(s) / ED Diagnoses Final diagnoses:  Chest pain, unspecified type  Gastroesophageal reflux disease with esophagitis, unspecified whether hemorrhage    Rx / DC Orders ED Discharge Orders     None         Rondel Baton, MD 07/26/22 [phone]

## 2022-07-26 NOTE — ED Triage Notes (Signed)
Pt arrived POV with c/c of chest pain. Per pt it start around 0800 and he he took a tylenol with no relief. He believes he started to panic whenever the numbness run down his left arm and up to behind his ears. Pt endorsed some shOB when it happened. Pt stated that some symptoms started in November.

## 2022-07-28 ENCOUNTER — Ambulatory Visit (HOSPITAL_COMMUNITY)
Admission: RE | Admit: 2022-07-28 | Discharge: 2022-07-28 | Disposition: A | Discharge status: Home / Self Care | Payer: 59 | Source: Ambulatory Visit | Attending: Family Medicine | Admitting: Family Medicine

## 2022-07-28 ENCOUNTER — Other Ambulatory Visit (HOSPITAL_COMMUNITY): Payer: Self-pay | Admitting: Family Medicine

## 2022-07-28 DIAGNOSIS — R221 Localized swelling, mass and lump, neck: Secondary | ICD-10-CM | POA: Insufficient documentation

## 2022-09-19 ENCOUNTER — Other Ambulatory Visit (HOSPITAL_COMMUNITY): Payer: Self-pay | Admitting: Family Medicine

## 2022-09-19 DIAGNOSIS — M542 Cervicalgia: Secondary | ICD-10-CM

## 2022-09-21 ENCOUNTER — Other Ambulatory Visit (HOSPITAL_COMMUNITY): Payer: Self-pay | Admitting: Radiology

## 2022-09-21 DIAGNOSIS — R0600 Dyspnea, unspecified: Secondary | ICD-10-CM

## 2022-12-13 ENCOUNTER — Other Ambulatory Visit: Payer: Self-pay

## 2022-12-13 ENCOUNTER — Encounter (HOSPITAL_COMMUNITY): Payer: Self-pay | Admitting: Emergency Medicine

## 2022-12-13 ENCOUNTER — Emergency Department (HOSPITAL_COMMUNITY)
Admission: EM | Admit: 2022-12-13 | Discharge: 2022-12-13 | Disposition: A | Discharge status: Home / Self Care | Payer: 59 | Attending: Emergency Medicine | Admitting: Emergency Medicine

## 2022-12-13 DIAGNOSIS — R55 Syncope and collapse: Secondary | ICD-10-CM | POA: Diagnosis present

## 2022-12-13 DIAGNOSIS — R000 Tachycardia, unspecified: Secondary | ICD-10-CM | POA: Diagnosis not present

## 2022-12-13 LAB — BASIC METABOLIC PANEL WITH GFR
Anion gap: 11 (ref 5–15)
BUN: 8 mg/dL (ref 6–20)
CO2: 26 mmol/L (ref 22–32)
Calcium: 9.5 mg/dL (ref 8.9–10.3)
Chloride: 104 mmol/L (ref 98–111)
Creatinine, Ser: 0.94 mg/dL (ref 0.61–1.24)
GFR, Estimated: >60 mL/min (ref >60)
Glucose, Bld: 104 mg/dL — ABNORMAL HIGH (ref 70–99)
Potassium: 4.4 mmol/L (ref 3.5–5.1)
Sodium: 141 mmol/L (ref 135–145)

## 2022-12-13 LAB — CBC
HCT: 43.5 % (ref 39.0–52.0)
Hemoglobin: 15.3 g/dL (ref 13.0–17.0)
MCH: 32.7 pg (ref 26.0–34.0)
MCHC: 35.2 g/dL (ref 30.0–36.0)
MCV: 92.9 fL (ref 80.0–100.0)
Platelets: 366 10*3/uL (ref 150–400)
RBC: 4.68 MIL/uL (ref 4.22–5.81)
RDW: 13 % (ref 11.5–15.5)
WBC: 8.3 10*3/uL (ref 4.0–10.5)
nRBC: 0 % (ref 0.0–0.2)

## 2022-12-13 LAB — URINALYSIS, ROUTINE W REFLEX MICROSCOPIC
Bilirubin Urine: NEGATIVE
Glucose, UA: NEGATIVE mg/dL
Hgb urine dipstick: NEGATIVE
Ketones, ur: NEGATIVE mg/dL
Leukocytes,Ua: NEGATIVE
Nitrite: NEGATIVE
Protein, ur: NEGATIVE mg/dL
Specific Gravity, Urine: 1.02 (ref 1.005–1.030)
pH: 7.5 (ref 5.0–8.0)

## 2022-12-13 LAB — TROPONIN I (HIGH SENSITIVITY): Troponin I (High Sensitivity): 4 ng/L (ref <18)

## 2022-12-13 NOTE — ED Triage Notes (Signed)
Per [facility] pt was driving down highway and began feeling like he was going to pass out, had a pressure in his chest and had shortness of breath. Reports hx of same. Has appt with neuro later this year. Patient states this has been happening intermittently since last November. States symptoms have improved since getting here.

## 2022-12-13 NOTE — ED Provider Notes (Signed)
[place] EMERGENCY DEPARTMENT AT [facility] Provider Note   CSN: [id number] Arrival date & time: 12/13/22  0746     History  Chief Complaint  Patient presents with   Near Syncope    Henry Peck is a 42 y.o. male.  42 yo M with a cc of a near syncopal event.  Patient states that this been going on for about a year now.  He has had a strange taste in his mouth for some time.  He tells me that he was driving his car and suddenly felt unwell he felt like his vision was dimming and he felt he was hyperventilating.  He pulled his car over and never did pass out and now feels totally better.  He says that he gets these events may be every other month or so.  Never upon exercising.  No chest pain or difficulty breathing onset of these events.  The events are always self-limited.  Nothing in particular he seems to be doing that causes them.  Typically has his vision to him sometimes gets nauseated and sweaty.  He had had some indigestion and so had an upper endoscopy while this was going on.  He has an upcoming appointment with neurology.  Has had some neck pain off and on and so had been seen recently by orthopedics.   Near Syncope       Home Medications Prior to Admission medications   Medication Sig Start Date End Date Taking? Authorizing Provider  acetaminophen (TYLENOL) 500 MG tablet Take 500 mg by mouth every 6 (six) hours as needed (Pain). Patient not taking: Reported on 07/26/2022    [provider]  amoxicillin (AMOXIL) 500 MG capsule Take 1 capsule (500 mg total) by mouth 3 (three) times daily. Patient not taking: Reported on 07/26/2022 04/29/22   Gilda Crease, MD  ibuprofen (ADVIL,MOTRIN) 200 MG tablet Take 400 mg by mouth every 6 (six) hours as needed (Pain). Patient not taking: Reported on 07/26/2022    [provider]  oxyCODONE-acetaminophen (ROXICET) 5-325 MG per tablet Take 1-2 tablets by mouth every 4 (four) hours as needed. Patient not  taking: Reported on 07/26/2022 08/14/14   [name], MD      Allergies    Codeine and Duloxetine    Review of Systems   Review of Systems  Cardiovascular:  Positive for near-syncope.    Physical Exam Updated Vital Signs BP 136/82   Pulse 76   Temp 98.5 F (36.9 C) (Oral)   Resp 19   Ht 6' (1.829 m)   Wt 86.2 kg   SpO2 99%   BMI 25.77 kg/m  Physical Exam Vitals and nursing note reviewed.  Constitutional:      Appearance: He is well-developed.  HENT:     Head: Normocephalic and atraumatic.  Eyes:     Pupils: Pupils are equal, round, and reactive to light.  Neck:     Vascular: No JVD.  Cardiovascular:     Rate and Rhythm: Normal rate and regular rhythm.     Heart sounds: No murmur heard.    No friction rub. No gallop.  Pulmonary:     Effort: No respiratory distress.     Breath sounds: No wheezing.  Abdominal:     General: There is no distension.     Tenderness: There is no abdominal tenderness. There is no guarding or rebound.  Musculoskeletal:        General: Normal range of motion.  Cervical back: Normal range of motion and neck supple.  Skin:    Coloration: Skin is not pale.     Findings: No rash.  Neurological:     Mental Status: He is alert and oriented to person, place, and time.  Psychiatric:        Behavior: Behavior normal.     ED Results / Procedures / Treatments   Labs (all labs ordered are listed, but only abnormal results are displayed) Labs Reviewed  BASIC METABOLIC PANEL - Abnormal; Notable for the following components:      Result Value   Glucose, Bld 104 (*)    All other components within normal limits  CBC  URINALYSIS, ROUTINE W REFLEX MICROSCOPIC  TROPONIN I (HIGH SENSITIVITY)    EKG EKG Interpretation Date/Time:  Wednesday December 13 2022 07:28:20 EDT Ventricular Rate:  102 PR Interval:  120 QRS Duration:  84 QT Interval:  350 QTC Calculation: 456 R Axis:   169  Text Interpretation: Sinus tachycardia Right axis  deviation Abnormal ECG no wpw, prolonged qt or brugada Otherwise no significant change Confirmed by Melene Plan [phone]) on 12/13/2022 3:33:32 PM  Radiology No results found.  Procedures Procedures    Medications Ordered in ED Medications - No data to display  ED Course/ Medical Decision Making/ A&P                                 Medical Decision Making Amount and/or Complexity of Data Reviewed Labs: ordered.   42 yo M with a chief complaints of a near syncopal event.  By history it sounds like he is having recurrent vasovagal reactions.  He is asymptomatic currently.  Lab work without anemia, no significant electrolyte abnormalities.  A troponin was ordered through the [facility]SE process and is negative.  EKG without Wolff-Parkinson-White Brugada or prolonged QT.  I discussed further workup with the patient.  I think at this point he would be best served by a referral to the cardiology office as he has had multiple events over the past year.  4:07 PM:  I have discussed the diagnosis/risks/treatment options with the patient.  Evaluation and diagnostic testing in the emergency department does not suggest an emergent condition requiring admission or immediate intervention beyond what has been performed at this time.  They will follow up with PCP. We also discussed returning to the ED immediately if new or worsening sx occur. We discussed the sx which are most concerning (e.g., sudden worsening pain, fever, inability to tolerate by mouth) that necessitate immediate return. Medications administered to the patient during their visit and any new prescriptions provided to the patient are listed below.  Medications given during this visit Medications - No data to display   The patient appears reasonably screen and/or stabilized for discharge and I doubt any other medical condition or other [facility] requiring further screening, evaluation, or treatment in the ED at this time prior to discharge.           Final Clinical Impression(s) / ED Diagnoses Final diagnoses:  Near syncope    Rx / DC Orders ED Discharge Orders          Ordered    Ambulatory referral to Cardiology       Comments: If you have not heard from the Cardiology office within the next 72 hours please call [phone].   12/13/22 1550  Melene Plan, DO 12/13/22 [phone]

## 2022-12-13 NOTE — ED Notes (Signed)
Patient verbalizes understanding of discharge instructions. Opportunity for questioning and answers were provided. Armband removed by staff, pt discharged from ED. Pt ambulatory to ED waiting room with steady gait.

## 2022-12-13 NOTE — Discharge Instructions (Signed)
Eat and drink as well as you can for the next few days.  Please follow-up with the cardiologist and your family doctor and neurology in the office.  Please return for recurrent event especially if it occurs while you are exercising or going up stairs.

## 2022-12-25 ENCOUNTER — Emergency Department (HOSPITAL_COMMUNITY)
Admission: EM | Admit: 2022-12-25 | Discharge: 2022-12-26 | Disposition: A | Discharge status: Home / Self Care | Payer: 59 | Attending: Emergency Medicine | Admitting: Emergency Medicine

## 2022-12-25 ENCOUNTER — Encounter (HOSPITAL_COMMUNITY): Payer: Self-pay | Admitting: Emergency Medicine

## 2022-12-25 ENCOUNTER — Other Ambulatory Visit: Payer: Self-pay

## 2022-12-25 DIAGNOSIS — R55 Syncope and collapse: Secondary | ICD-10-CM | POA: Diagnosis present

## 2022-12-25 LAB — CBC
HCT: 44.5 % (ref 39.0–52.0)
Hemoglobin: 15.2 g/dL (ref 13.0–17.0)
MCH: 31.7 pg (ref 26.0–34.0)
MCHC: 34.2 g/dL (ref 30.0–36.0)
MCV: 92.7 fL (ref 80.0–100.0)
Platelets: 372 10*3/uL (ref 150–400)
RBC: 4.8 MIL/uL (ref 4.22–5.81)
RDW: 12.3 % (ref 11.5–15.5)
WBC: 10.9 10*3/uL — ABNORMAL HIGH (ref 4.0–10.5)
nRBC: 0 % (ref 0.0–0.2)

## 2022-12-25 LAB — BASIC METABOLIC PANEL
Anion gap: 10 (ref 5–15)
BUN: 12 mg/dL (ref 6–20)
CO2: 25 mmol/L (ref 22–32)
Calcium: 9.5 mg/dL (ref 8.9–10.3)
Chloride: 103 mmol/L (ref 98–111)
Creatinine, Ser: 0.79 mg/dL (ref 0.61–1.24)
GFR, Estimated: >60 mL/min (ref >60)
Glucose, Bld: 99 mg/dL (ref 70–99)
Potassium: 3.7 mmol/L (ref 3.5–5.1)
Sodium: 138 mmol/L (ref 135–145)

## 2022-12-25 LAB — CBG MONITORING, ED: Glucose-Capillary: 81 mg/dL (ref 70–99)

## 2022-12-25 NOTE — ED Triage Notes (Signed)
Pt BIB family after pt had another episode of feeling like he was going to pass out. Pt has had multiple similar episodes for the past year. Pt also c/o constant neck pain since this all started.

## 2022-12-26 LAB — URINALYSIS, ROUTINE W REFLEX MICROSCOPIC
Bilirubin Urine: NEGATIVE
Glucose, UA: NEGATIVE mg/dL
Hgb urine dipstick: NEGATIVE
Ketones, ur: NEGATIVE mg/dL
Leukocytes,Ua: NEGATIVE
Nitrite: NEGATIVE
Protein, ur: NEGATIVE mg/dL
Specific Gravity, Urine: 1.021 (ref 1.005–1.030)
pH: 5 (ref 5.0–8.0)

## 2022-12-26 NOTE — ED Notes (Signed)
ED Provider at bedside. 

## 2022-12-26 NOTE — ED Provider Notes (Signed)
[place] EMERGENCY DEPARTMENT AT [facility] Provider Note   CSN: [id number] Arrival date & time: 12/25/22  1845     History  Chief Complaint  Patient presents with   Near Syncope    Henry Peck is a 42 y.o. male.  The history is provided by the patient and the spouse.  Near Syncope Pertinent negatives include no chest pain and no shortness of breath.  Patient presents for recurrent near syncope.  Patient reports he was working on his lawnmower earlier in the day when he felt lightheaded and went to sit down.  He reports he started having spasms in his lower extremities and his wife reports that he had some shaking.  He was awake during entire event and it terminated spontaneously.  No previous history of seizures.  Patient reports has had episodes of near syncope for almost a year.  He has had multiple evaluations and is scheduled to see cardiology and neurology in the next few months.  He has no known cardiac disease.  He is not taking daily medications.  No chest pain or shortness of breath.  No recent traumatic injuries    Patient also has multiple other complaints including neck pain and a lump on his left neck.  This is been ongoing for a while.  Also reports he has a foul taste in his mouth, and is even had multiple teeth pulled without this improving Patient reports he is working on his disability Home Medications Prior to Admission medications   Not on File      Allergies    Codeine and Duloxetine    Review of Systems   Review of Systems  Constitutional:  Negative for fever.  Respiratory:  Negative for shortness of breath.   Cardiovascular:  Positive for near-syncope. Negative for chest pain.  Gastrointestinal:  Negative for vomiting.  Neurological:  Positive for light-headedness.    Physical Exam Updated Vital Signs BP 123/79   Pulse 77   Temp 98.7 F (37.1 C) (Oral)   Resp 14   Ht 1.829 m (6')   Wt 86.2 kg   SpO2 99%   BMI 25.77 kg/m   Physical Exam CONSTITUTIONAL: Well developed/well nourished HEAD: Normocephalic/atraumatic EYES: EOMI/PERRL ENMT: Mucous membranes moist, poor dentition NECK: supple no meningeal signs No lymphadenopathy, no bruising, no thrills SPINE/BACK:entire spine nontender CV: S1/S2 noted, no murmurs/rubs/gallops noted LUNGS: Lungs are clear to auscultation bilaterally, no apparent distress ABDOMEN: soft, nontender NEURO: Pt is awake/alert/appropriate, moves all extremitiesx4.  No facial droop.  No arm or leg drift.  Patient ambulates without difficulty EXTREMITIES: pulses normal/equal, full ROM SKIN: warm, color normal PSYCH: no abnormalities of mood noted, alert and oriented to situation  ED Results / Procedures / Treatments   Labs (all labs ordered are listed, but only abnormal results are displayed) Labs Reviewed  CBC - Abnormal; Notable for the following components:      Result Value   WBC 10.9 (*)    All other components within normal limits  BASIC METABOLIC PANEL  URINALYSIS, ROUTINE W REFLEX MICROSCOPIC  CBG MONITORING, ED    EKG EKG Interpretation Date/Time:  Tuesday December 26 2022 00:15:35 EDT Ventricular Rate:  84 PR Interval:  126 QRS Duration:  95 QT Interval:  361 QTC Calculation: 427 R Axis:   64  Text Interpretation: Sinus rhythm Minimal ST elevation, inferior leads Confirmed by Zadie Rhine ([id number]) on 12/26/2022 1:21:43 AM  Radiology No results found.  Procedures Procedures    Medications  Ordered in ED Medications - No data to display  ED Course/ Medical Decision Making/ A&P                                 Medical Decision Making Amount and/or Complexity of Data Reviewed Labs: ordered. ECG/medicine tests: ordered.   This patient presents to the ED for concern of near syncope, this involves an extensive number of treatment options, and is a complaint that carries with it a high risk of complications and morbidity.  The differential diagnosis  includes but is not limited to vasovagal, cardiac dysrhythmia, dehydration, seizure  Additional history obtained: Additional history obtained from spouse  Lab Tests: I Ordered, and personally interpreted labs.  The pertinent results include: Labs overall unremarkable  Test Considered: I considered further testing, the patient had near syncopal episodes intermittently for up to a year.  He already has outpatient follow-up arranged.  No red flags on history to suggest cardiac dysrhythmia or acute neurologic event   Reevaluation: After the interventions noted above, I reevaluated the patient and found that they have :improved  Complexity of problems addressed: Patient's presentation is most consistent with  acute presentation with potential threat to life or bodily function  Disposition: After consideration of the diagnostic results and the patient's response to treatment,  I feel that the patent would benefit from discharge   .           Final Clinical Impression(s) / ED Diagnoses Final diagnoses:  Near syncope    Rx / DC Orders ED Discharge Orders     None         Zadie Rhine, MD 12/26/22 [phone]

## 2022-12-26 NOTE — ED Notes (Signed)
Pt states unable to provide a urine sample at this time. Urinal placed at bedside for use

## 2023-01-17 ENCOUNTER — Ambulatory Visit: Payer: 59 | Attending: Cardiovascular Disease | Admitting: Internal Medicine

## 2023-01-17 ENCOUNTER — Encounter: Payer: Self-pay | Admitting: Internal Medicine

## 2023-01-17 VITALS — BP 116/82 | HR 80 | Ht 72.0 in | Wt 174.0 lb

## 2023-01-17 DIAGNOSIS — Z136 Encounter for screening for cardiovascular disorders: Secondary | ICD-10-CM | POA: Diagnosis not present

## 2023-01-17 DIAGNOSIS — R42 Dizziness and giddiness: Secondary | ICD-10-CM | POA: Insufficient documentation

## 2023-01-17 NOTE — Patient Instructions (Addendum)
Medication Instructions:  Your physician recommends that you continue on your current medications as directed. Please refer to the Current Medication list given to you today.   Labwork: None  Testing/Procedures: Your physician has requested that you have an echocardiogram. Echocardiography is a painless test that uses sound waves to create images of your heart. It provides your doctor with information about the size and shape of your heart and how well your heart's chambers and valves are working. This procedure takes approximately one hour. There are no restrictions for this procedure. Please do NOT wear cologne, perfume, aftershave, or lotions (deodorant is allowed). Please arrive 15 minutes prior to your appointment time.  Your physician has requested that you have an exercise tolerance test. For further information please visit [email or web address] Please also follow instruction sheet, as given.   Follow-Up: Your physician recommends that you schedule a follow-up appointment in: Pending results  Any Other Special Instructions Will Be Listed Below (If Applicable).   Thank you for choosing [facility]!      If you need a refill on your cardiac medications before your next appointment, please call your pharmacy.

## 2023-01-17 NOTE — Progress Notes (Signed)
Cardiology Office Note  Date: 01/17/2023   ID: Henry Peck, DOB [birth year], MRN [id number]  PCP:  [facility]  Cardiologist:  Marjo Bicker, MD Electrophysiologist:  None   History of Present Illness: Henry Peck is a 42 y.o. male known to have anxiety was referred to cardiology clinic for evaluation of dizziness.  Patient reported having episodes of a prodrome of lightheadedness followed by tunnel vision and presyncope.  Has some palpitations during this time.  His body feels hot and he has to get near an air conditioner to get some fresh/cool air.  Sometimes he also has neck pain radiating to his right shoulder.  No DOE, orthopnea, PND, syncope.  No angina.  Past Medical History:  Diagnosis Date   Complication of anesthesia    "extra thirsty-a little upset"   Headache    History of kidney stones    possible one stone   Inguinal hernia    bilateral    Past Surgical History:  Procedure Laterality Date   FRACTURE SURGERY Right ~18 years ago   hand, removed knuckle   INGUINAL HERNIA REPAIR Bilateral 08/14/2014   Procedure: LAPAROSCOPIC BILATERAL INGUINAL HERNIA REPAIR;  Surgeon: [name], MD;  Location: [facility];  Service: General;  Laterality: Bilateral;   INSERTION OF MESH Bilateral 08/14/2014   Procedure: INSERTION OF MESH;  Surgeon: [name], MD;  Location: [facility];  Service: General;  Laterality: Bilateral;   WISDOM TOOTH EXTRACTION      Current Outpatient Medications  Medication Sig Dispense Refill   hydrOXYzine (ATARAX) 25 MG tablet Take 25 mg by mouth every 4 (four) hours as needed for anxiety.     Phenyleph-Doxylamine-DM-APAP (NYQUIL SEVERE COLD/FLU PO)      No current facility-administered medications for this visit.   Allergies:  Codeine and Duloxetine   Social History: The patient  reports that he quit smoking about 3 weeks ago. His smoking use included cigarettes. He has a 20 pack-year smoking history. He has  never used smokeless tobacco. He reports current alcohol use. He reports current drug use. Drug: Marijuana.   Family History: The patient's family history is not on file.   ROS:  Please see the history of present illness. Otherwise, complete review of systems is positive for none  All other systems are reviewed and negative.   Physical Exam: VS:  BP 116/82 (BP Location: Left Arm, Patient Position: Sitting, Cuff Size: Normal)   Pulse 80   Ht 6' (1.829 m)   Wt 174 lb (78.9 kg)   SpO2 98%   BMI 23.60 kg/m , BMI Body mass index is 23.6 kg/m.  Wt Readings from Last 3 Encounters:  01/17/23 174 lb (78.9 kg)  12/25/22 190 lb (86.2 kg)  12/13/22 190 lb (86.2 kg)    General: Patient appears comfortable at rest. HEENT: Conjunctiva and lids normal, oropharynx clear with moist mucosa. Neck: Supple, no elevated JVP or carotid bruits, no thyromegaly. Lungs: Clear to auscultation, nonlabored breathing at rest. Cardiac: Regular rate and rhythm, no S3 or significant systolic murmur, no pericardial rub. Abdomen: Soft, nontender, no hepatomegaly, bowel sounds present, no guarding or rebound. Extremities: No pitting edema, distal pulses 2+. Skin: Warm and dry. Musculoskeletal: No kyphosis. Neuropsychiatric: Alert and oriented x3, affect grossly appropriate.  Recent Labwork: 07/26/2022: TSH 1.790 12/25/2022: BUN 12; Creatinine, Ser 0.79; Hemoglobin 15.2; Platelets 372; Potassium 3.7; Sodium 138  No results found for: "CHOL", "TRIG", "HDL", "CHOLHDL", "VLDL", "LDLCALC", "LDLDIRECT"  Assessment and Plan:  Dizziness/presyncope   -Obtain 2D echocardiogram to rule out structural heart disease, 30-day event monitor for evaluation of presyncope and ETT to rule out exertional arrhythmias.  I spent a total duration 30 minutes reviewing the chart, face-to-face discussion/counseling with medical condition, pathophysiology, evaluation, management, answered all his questions and documenting the findings in  the note.    Medication Adjustments/Labs and Tests Ordered: Current medicines are reviewed at length with the patient today.  Concerns regarding medicines are outlined above.    Disposition:  Follow up  pending results  Signed Ahuva Poynor Verne Spurr, MD, 01/17/2023 4:16 PM    [facility] [address] [place], [place], [place] [address]

## 2023-01-22 ENCOUNTER — Ambulatory Visit (HOSPITAL_COMMUNITY)
Admission: RE | Admit: 2023-01-22 | Discharge: 2023-01-22 | Disposition: A | Discharge status: Home / Self Care | Payer: 59 | Source: Ambulatory Visit | Attending: Internal Medicine | Admitting: Internal Medicine

## 2023-01-22 DIAGNOSIS — R42 Dizziness and giddiness: Secondary | ICD-10-CM | POA: Diagnosis present

## 2023-01-22 LAB — EXERCISE TOLERANCE TEST
Angina Index: 0
Base ST Depression (mm): 0 mm
Duke Treadmill Score: 7
Estimated workload: 8.7
Exercise duration (min): 6 min
Exercise duration (sec): 34 s
MPHR: 180 {beats}/min
Peak HR: 160 {beats}/min
Percent HR: 88 %
RPE: 17
Rest HR: 77 {beats}/min
ST Depression (mm): 0 mm

## 2023-01-22 NOTE — Addendum Note (Signed)
Addended by: Carmelina Paddock on: 01/22/2023 08:39 AM   Modules accepted: Orders

## 2023-01-31 ENCOUNTER — Ambulatory Visit: Payer: 59 | Attending: Internal Medicine

## 2023-01-31 DIAGNOSIS — R42 Dizziness and giddiness: Secondary | ICD-10-CM

## 2023-01-31 LAB — ECHOCARDIOGRAM COMPLETE
AR max vel: 3.38 cm2
AV Area VTI: 3.55 cm2
AV Area mean vel: 3.08 cm2
AV Mean grad: 3 mm[Hg]
AV Peak grad: 5.5 mm[Hg]
Ao pk vel: 1.17 m/s
Area-P 1/2: 4.52 cm2
Calc EF: 63.6 %
MV VTI: 2.16 cm2
S' Lateral: 3.1 cm
Single Plane A2C EF: 71.4 %
Single Plane A4C EF: 52 %

## 2023-02-01 ENCOUNTER — Ambulatory Visit: Payer: 59 | Admitting: Internal Medicine

## 2023-02-06 ENCOUNTER — Ambulatory Visit: Payer: 59 | Admitting: Cardiovascular Disease

## 2023-02-26 ENCOUNTER — Ambulatory Visit (INDEPENDENT_AMBULATORY_CARE_PROVIDER_SITE_OTHER): Payer: 59 | Admitting: Neurology

## 2023-02-26 ENCOUNTER — Encounter: Payer: Self-pay | Admitting: Neurology

## 2023-02-26 VITALS — Resp 15 | Ht 72.0 in | Wt 170.0 lb

## 2023-02-26 DIAGNOSIS — F419 Anxiety disorder, unspecified: Secondary | ICD-10-CM | POA: Diagnosis not present

## 2023-02-26 DIAGNOSIS — R42 Dizziness and giddiness: Secondary | ICD-10-CM | POA: Diagnosis not present

## 2023-02-26 DIAGNOSIS — M62838 Other muscle spasm: Secondary | ICD-10-CM | POA: Diagnosis not present

## 2023-02-26 DIAGNOSIS — M542 Cervicalgia: Secondary | ICD-10-CM | POA: Diagnosis not present

## 2023-02-26 DIAGNOSIS — R55 Syncope and collapse: Secondary | ICD-10-CM

## 2023-02-26 MED ORDER — GABAPENTIN 300 MG PO CAPS: 300.0000 mg | ORAL_CAPSULE | Freq: Every day | ORAL | 6 refills | Status: DC

## 2023-02-26 NOTE — Progress Notes (Signed)
[facility]  PATIENT: Henry Peck DOB: [birth year]  REQUESTING CLINICIAN: Windle Guard, MD HISTORY FROM: Patient and spouse REASON FOR VISIT: Cervicalgia/ Presyncope    HISTORICAL  CHIEF COMPLAINT:  Chief Complaint  Patient presents with   New Patient (Initial Visit)    Rm12, wife present, referral for Dizziness, lethargy, blurred vision from Dr. Ceasar Lund at [facility]: pt stated that he has episodes where he feels like he will pass out, vision goes black, sweaty, all this started about a year ago. Pt stated neck and shoulder seems tight. Also has migraines 2/30 days. Pt also mentioned getting a weird taste in mouth.     HISTORY OF PRESENT ILLNESS:  Discussed the use of AI scribe software for clinical note transcription with the patient, who gave verbal consent to proceed.  History of Present Illness   The patient, with a history of anxiety, has been experiencing a constellation of symptoms for approximately a year. The initial episode occurred before a meal, where he experienced a sensation of impending sickness, followed by profuse sweating and visual blackouts, although consciousness was maintained throughout. These episodes have recurred multiple times, leading to multiple hospital visits. The symptoms include lightheadedness, a sensation of coldness in the extremities, particularly the feet, and muscle tightness in the upper body. He has been diagnosed with presyncope  The patient also reported a peculiar taste in his mouth, which varies between sweet and a taste reminiscent of being ill. He also reported a sensation of a knot in his neck, which appears and disappears. He had US of the neck which was normal. The patient has been treated for anxiety, with the addition of sertraline and Atarax to his regimen. However, he reported headaches and ear pain after starting sertraline. Dose has not been increased.   The patient also reported muscle spasms  and a sensation of his muscles 'popping loose' or releasing, which he has been managing with over-the-counter sleep aids and Nyquil. He had a negative experience with a prescribed muscle relaxer, which he reported caused severe migraines. He was seen at [facility], had a cervical spine xray,  was recommended PT but he wanted to wait until his Neurology visit.   The patient's primary care provider had initiated investigations for a possible rheumatological condition, given a positive ANA result and the patient's report of cold extremities. However, the patient's symptoms do not seem to fit a clear rheumatological pattern.  The patient's symptoms have caused significant distress and have interfered with his daily activities, including work. Despite multiple investigations and treatments, the patient's symptoms persist, causing frustration due to the lack of a clear diagnosis.       OTHER MEDICAL CONDITIONS: Anxiety    REVIEW OF SYSTEMS: Full 14 system review of systems performed and negative with exception of: As noted in the HPI   ALLERGIES: Allergies  Allergen Reactions   Codeine Nausea And Vomiting   Duloxetine Other (See Comments)    Dizziness     HOME MEDICATIONS: Outpatient Medications Prior to Visit  Medication Sig Dispense Refill   doxylamine, Sleep, (UNISOM) 25 MG tablet Take 25 mg by mouth at bedtime.     hydrOXYzine (ATARAX) 25 MG tablet Take 25 mg by mouth every 4 (four) hours as needed for anxiety.     Phenyleph-Doxylamine-DM-APAP (NYQUIL SEVERE COLD/FLU PO)      Phenylephrine-Doxylamine-DM (NYQUIL COUGH DM + CONGESTION) 5-6.25-10 MG/15ML LIQD Take 1 tablet by mouth at bedtime.     sertraline (ZOLOFT) 50  MG tablet Take 50 mg by mouth daily.     No facility-administered medications prior to visit.    PAST MEDICAL HISTORY: Past Medical History:  Diagnosis Date   Complication of anesthesia    "extra thirsty-a little upset"   Headache    History of kidney stones     possible one stone   Inguinal hernia    bilateral    PAST SURGICAL HISTORY: Past Surgical History:  Procedure Laterality Date   FRACTURE SURGERY Right ~18 years ago   hand, removed knuckle   INGUINAL HERNIA REPAIR Bilateral 08/14/2014   Procedure: LAPAROSCOPIC BILATERAL INGUINAL HERNIA REPAIR;  Surgeon: [name], MD;  Location: [facility];  Service: General;  Laterality: Bilateral;   INSERTION OF MESH Bilateral 08/14/2014   Procedure: INSERTION OF MESH;  Surgeon: [name], MD;  Location: [facility];  Service: General;  Laterality: Bilateral;   WISDOM TOOTH EXTRACTION      FAMILY HISTORY: History reviewed. No pertinent family history.  SOCIAL HISTORY: Social History   Socioeconomic History   Marital status: Married    Spouse name: Not on file   Number of children: 3   Years of education: Not on file   Highest education level: 12th grade  Occupational History   Not on file  Tobacco Use   Smoking status: Every Day    Current packs/day: 0.00    Average packs/day: 1 pack/day for 20.0 years (20.0 ttl pk-yrs)    Types: Cigarettes    Last attempt to quit: 12/26/2022    Years since quitting: 0.1   Smokeless tobacco: Never  Vaping Use   Vaping status: Never Used  Substance and Sexual Activity   Alcohol use: Yes    Alcohol/week: 21.0 standard drinks of alcohol    Types: 21 Standard drinks or equivalent per week    Comment: rare   Drug use: Yes    Types: Marijuana    Comment: last use 18 years ago   Sexual activity: Yes    Birth control/protection: None  Other Topics Concern   Not on file  Social History Narrative   ** Merged History Encounter **       Social Determinants of Health   Financial Resource Strain: Not on file  Food Insecurity: Not on file  Transportation Needs: Not on file  Physical Activity: Not on file  Stress: Not on file (02/03/2023)  Social Connections: Not on file  Intimate Partner Violence: Not on file    PHYSICAL EXAM  GENERAL  EXAM/CONSTITUTIONAL: Vitals:  Vitals:   02/26/23 0815  Resp: 15  Weight: 170 lb (77.1 kg)  Height: 6' (1.829 m)   Body mass index is 23.06 kg/m. Wt Readings from Last 3 Encounters:  02/26/23 170 lb (77.1 kg)  01/17/23 174 lb (78.9 kg)  12/25/22 190 lb (86.2 kg)   Patient is in no distress; well developed, nourished and groomed; neck is supple. Appears anxious   MUSCULOSKELETAL: Gait, strength, tone, movements noted in Neurologic exam below  NEUROLOGIC: MENTAL STATUS:      No data to display         awake, alert, oriented to person, place and time recent and remote memory intact normal attention and concentration language fluent, comprehension intact, naming intact fund of knowledge appropriate  CRANIAL NERVE:  2nd, 3rd, 4th, 6th - Visual fields full to confrontation, extraocular muscles intact, no nystagmus 5th - facial sensation symmetric 7th - facial strength symmetric 8th - hearing intact 9th - palate elevates symmetrically, uvula  midline 11th - shoulder shrug symmetric 12th - tongue protrusion midline  MOTOR:  normal bulk and tone, full strength in the BUE, BLE  SENSORY:  normal and symmetric to light touch  COORDINATION:  finger-nose-finger, fine finger movements normal  REFLEXES:  deep tendon reflexes present and symmetric  GAIT/STATION:  normal     DIAGNOSTIC DATA (LABS, IMAGING, TESTING) - I reviewed patient records, labs, notes, testing and imaging myself where available.  Lab Results  Component Value Date   WBC 10.9 (H) 12/25/2022   HGB 15.2 12/25/2022   HCT 44.5 12/25/2022   MCV 92.7 12/25/2022   PLT 372 12/25/2022      Component Value Date/Time   NA 138 12/25/2022 2000   K 3.7 12/25/2022 2000   CL 103 12/25/2022 2000   CO2 25 12/25/2022 2000   GLUCOSE 99 12/25/2022 2000   BUN 12 12/25/2022 2000   CREATININE 0.79 12/25/2022 2000   CALCIUM 9.5 12/25/2022 2000   GFRNONAA >60 12/25/2022 2000   No results found for: "CHOL",  "HDL", "LDLCALC", "LDLDIRECT", "TRIG", "CHOLHDL" No results found for: "HGBA1C" No results found for: "VITAMINB12" Lab Results  Component Value Date   TSH 1.790 07/26/2022    X-ray cervical spine done at 12/06/2022 at [facility]: Straightening of normal cervical lordosis.  Mild disc space narrowing at the mid cervical disc spaces, mild at C4/5 and C5/6.   ASSESSMENT AND PLAN  42 y.o. year old male with  Assessment and Plan    Presyncope   He experiences intermittent episodes of presyncope, characterized by vision dimming, sweating, and feeling cold, occurring monthly for about a year without loss of consciousness. Despite an extensive workup including blood tests and imaging yielding negative results. We advised on the importance of fluid intake and anxiety management, discussing the often benign nature of these episodes, typically related to hydration or anxiety. We will recommend increasing his fluid intake, continue sertraline for anxiety, and he should monitor for headache side effects and report to his primary care provider if symptoms persist. I have told patient that his symptoms are worsened by his anxiety, and he will need to follow up with PCP regarding management of his anxiety.   Cervical Muscle Spasm   He reports chronic neck pain and muscle tightness, particularly in the upper back and neck, with previous x-rays showing negative results. Although physical therapy was recommended, it has not been initiated, and muscle relaxers caused severe migraines. The differential diagnosis includes cervicalgia vs. tension-type headache and anxiety-related muscle tension. We referred him to physical therapy as the first line of treatment and prescribed gabapentin for 30 days with refills, to be taken at night. We also requested a TENS unit for his neck during physical therapy and will follow up after physical therapy to assess the need for an MRI.         1. Cervicalgia   2. Muscle spasms  of neck   3. Postural dizziness with presyncope   4. Anxiety      Patient Instructions  Continue current medications Continue to follow with PCP regarding management of the anxiety Referral to physical therapy for cervicalgia Trial of gabapentin 300 mg at night Please contact me for updates regarding the physical therapy and medications. If PT not helpful, will request MRI cervical spine.  Return as needed.  Orders Placed This Encounter  Procedures   Ambulatory referral to Physical Therapy    Meds ordered this encounter  Medications   gabapentin (NEURONTIN) 300 MG capsule  Sig: Take 1 capsule (300 mg total) by mouth at bedtime.    Dispense:  30 capsule    Refill:  6    Return if symptoms worsen or fail to improve.  I have spent a total of 50 minutes dedicated to this patient today, preparing to see patient, performing a medically appropriate examination and evaluation, ordering tests and/or medications and procedures, and counseling and educating the patient/family/caregiver; independently interpreting result and communicating results to the family/patient/caregiver; and documenting clinical information in the electronic medical record.   Windell Norfolk, MD 02/26/2023, 9:10 AM  [facility] [address], [address] [place], [place] [address] [phone]

## 2023-02-26 NOTE — Patient Instructions (Signed)
Continue current medications Continue to follow with PCP regarding management of the anxiety Referral to physical therapy for cervicalgia Trial of gabapentin 300 mg at night Please contact me for updates regarding the physical therapy and medications. If PT not helpful, will request MRI cervical spine.  Return as needed.

## 2023-03-06 ENCOUNTER — Ambulatory Visit (HOSPITAL_COMMUNITY): Payer: 59 | Attending: Neurology

## 2023-03-06 DIAGNOSIS — R293 Abnormal posture: Secondary | ICD-10-CM | POA: Diagnosis present

## 2023-03-06 DIAGNOSIS — M542 Cervicalgia: Secondary | ICD-10-CM | POA: Diagnosis present

## 2023-03-06 DIAGNOSIS — M6281 Muscle weakness (generalized): Secondary | ICD-10-CM | POA: Insufficient documentation

## 2023-03-06 DIAGNOSIS — M62838 Other muscle spasm: Secondary | ICD-10-CM | POA: Diagnosis present

## 2023-03-06 NOTE — Therapy (Signed)
[facility] OUTPATIENT PHYSICAL THERAPY UPPER EXTREMITY EVALUATION   Patient Name: Henry Peck MRN: [id number] DOB:May 29, 1980, 42 y.o., male Today's Date: 03/06/2023  END OF SESSION:   PT End of Session - 03/06/23 1630     Visit Number 1    Number of Visits 6    Authorization Type [employer]- other    PT Start Time 0315    PT Stop Time 0400    PT Time Calculation (min) 45 min    Activity Tolerance Patient tolerated treatment well             Past Medical History:  Diagnosis Date   Complication of anesthesia    "extra thirsty-a little upset"   Headache    History of kidney stones    possible one stone   Inguinal hernia    bilateral   Past Surgical History:  Procedure Laterality Date   FRACTURE SURGERY Right ~18 years ago   hand, removed knuckle   INGUINAL HERNIA REPAIR Bilateral 08/14/2014   Procedure: LAPAROSCOPIC BILATERAL INGUINAL HERNIA REPAIR;  Surgeon: [name], MD;  Location: [facility];  Service: General;  Laterality: Bilateral;   INSERTION OF MESH Bilateral 08/14/2014   Procedure: INSERTION OF MESH;  Surgeon: [name], MD;  Location: [facility];  Service: General;  Laterality: Bilateral;   WISDOM TOOTH EXTRACTION     Patient Active Problem List   Diagnosis Date Noted   Dizziness 01/17/2023   TOBACCO DEPENDENCE 05/24/2006    PCP: [facility] - General   REFERRING PROVIDER:   Windell Norfolk, MD    REFERRING DIAG:  Diagnosis  M54.2 (ICD-10-CM) - Cervicalgia  [phone] (ICD-10-CM) - Muscle spasms of neck    THERAPY DIAG:  Abnormal posture  Muscle weakness (generalized)  Cervicalgia  Rationale for Evaluation and Treatment: Rehabilitation  ONSET DATE: 1 year +  SUBJECTIVE:                                                                                                                                                                                      SUBJECTIVE STATEMENT: Patient has had pre syncopal episodes since last  year; 10+ episodes over the last year with subsequent hospital admissions. Patient states it happens 1x/ month where he experiences pre syncope symptoms such as loss of bilateral vision, weakness, sweating but reports no dizziness when episodes happen. Happens ~1x/ month at random times. In the last few weeks, MD visits show the following: + ANA blood test only, cardiac (-), upper GI/GERD (-). Patient is currently working with Neuro MD for work-up. No imaging of vertbral arteries done yet, no MRI yet.  Hand dominance: Right  PERTINENT HISTORY:  Pre syncope episodes 10+ Smoking  Bilateral hernia surgery Right wrist surgery   PAIN:  Are you having pain? Yes: NPRS scale: 3/10 Pain location: left upper cervical  Pain description: burning  Aggravating factors: varies Relieving factors: rest   PRECAUTIONS: None  RED FLAGS: None   WEIGHT BEARING RESTRICTIONS: No  FALLS:  Has patient fallen in last 6 months? No  OCCUPATION: [employer]   PLOF: Independent  PATIENT GOALS: To   NEXT MD VISIT: TBD  OBJECTIVE:   DIAGNOSTIC FINDINGS:  US soft tissue head/ neck  IMPRESSION: No mass identified by ultrasound. Is the patient's palpable abnormality enlarges or becomes painful, recommend CT of the neck with contrast to further evaluate.    COGNITION: Overall cognitive status: Within functional limits for tasks assessed     SENSATION: WFL  POSTURE: Moderate TH kyphosis, moderate forward head posture   CERVICAL ROM:   Active ROM A/PROM (deg) eval  Flexion 1 finger from chest   Extension WFL  Right lateral flexion 50%  Left lateral flexion 50%  Right rotation 75%  Left rotation 65%   (Blank rows = not tested)  UPPER EXTREMITY ROM:   Active ROM Right eval Left eval  Shoulder flexion [facility]  Shoulder extension    Shoulder abduction    Shoulder adduction    Functional internal rotation T12 T10  Functional external rotation tightness Tightness   Elbow flexion    Elbow  extension    Wrist flexion    Wrist extension    Wrist ulnar deviation    Wrist radial deviation    Wrist pronation    Wrist supination    (Blank rows = not tested; * = limited by pain )    UPPER EXTREMITY MMT:   MMT Right eval Left eval  Shoulder flexion 3+/5 3+/5  Shoulder extension    Shoulder abduction    Shoulder adduction    Shoulder internal rotation 3/5 3/5  Shoulder external rotation 3/5 3/5  Middle trapezius    Lower trapezius    Elbow flexion    Elbow extension    Wrist flexion    Wrist extension    Wrist ulnar deviation    Wrist radial deviation    Wrist pronation    Wrist supination    (Blank rows = not tested; * = limited by pain )   CERVICAL SPECIAL TESTS:  Sharp Purser Test: (-) Alar Ligament Test: (-) Distraction test: Negative   JOINT MOBILITY TESTING:  Limited Cervical sidebend to the right + left cervical rotation; left opening restriction   PALPATION:   Moderate tenderness to palpation left > right suboccipital, upper trap     TODAY'S TREATMENT:  DATE:   03/06/23 PT Initial Eval Supine manual therapy to cervical spine   PATIENT EDUCATION: Education details: postural control/dysfunction  Person educated: Patient and Spouse Education method: Explanation Education comprehension: verbalized understanding  HOME EXERCISE PROGRAM: TBD  ASSESSMENT:   CLINICAL IMPRESSION: Patient is a 42 y.o. male who was seen today for physical therapy evaluation and treatment for neck pain/ cervicalgia due to poor posture. Patient presents to PT with the following objective impairments: decreased activity tolerance, decreased endurance, decreased ROM, decreased strength, hypomobility, increased fascial restrictions, impaired flexibility, postural dysfunction, and pain. These impairments limit the patient in activities  such as carrying, lifting, reach over head, and hygiene/grooming. These impairments also limit the patient in participation such as meal prep, cleaning, laundry, personal finances, interpersonal relationship, driving, community activity, occupation, and yard work. The patient will benefit from PT to address the limitations/impairments listed below to return to their prior level of function in the domains of activity and participation.   PERSONAL FACTORS: 1 comorbidity: PMH smoking  are also affecting patient's functional outcome.   REHAB POTENTIAL: Good  CLINICAL DECISION MAKING: Stable/uncomplicated  EVALUATION COMPLEXITY: Low  GOALS: Goals reviewed with patient? No  SHORT TERM GOALS: Target date: 3 sessions    Patient will be independent with a basic stretching/strengthening HEP  Baseline: Goal status: INITIAL   2.  Patient will be able to lift an object of 10lbs from a hip height off of a stable surface with 1-2/10 pain to facilitate ADL completion and self-care  Baseline:  Goal status: INITIAL   LONG TERM GOALS: Target date: 6 sessions   Patient will be independent with a comprehensive strengthening HEP  Baseline:  Goal status: INITIAL   2.  Patient will be able to lift an object of 10lbs overhead with 1-2/10 pain to facilitate ADL completion and self-care  Baseline:  Goal status: INITIAL  4. Patient will increase cervical ROM by 5 degrees into bilateral rotation, lateral flexion to improve ADL completion and self-care Baseline:  Goal status: INITIAL   PLAN: PT FREQUENCY: 1-2x/week  PT DURATION:  6 sessions  PLANNED INTERVENTIONS: 97110-Therapeutic exercises, 97530- Therapeutic activity, 97112- Neuromuscular re-education, 97535- Self Care, 40981- Manual therapy, 97014- Electrical stimulation (unattended), [phone]- Electrical stimulation (manual), Patient/Family education, Taping, Dry Needling, Joint mobilization, Joint manipulation, Spinal manipulation, Spinal  mobilization, Vestibular training, Cryotherapy, and Moist heat  PLAN FOR NEXT SESSION: Manual therapy to cervical then Progress cervical active range of motion, shoulder strength, add to HEP.    Seymour Bars, PT 03/06/2023, 4:39 PM

## 2023-03-09 ENCOUNTER — Ambulatory Visit (HOSPITAL_COMMUNITY): Payer: 59

## 2023-03-09 DIAGNOSIS — R293 Abnormal posture: Secondary | ICD-10-CM

## 2023-03-09 DIAGNOSIS — M542 Cervicalgia: Secondary | ICD-10-CM

## 2023-03-09 DIAGNOSIS — M62838 Other muscle spasm: Secondary | ICD-10-CM

## 2023-03-09 DIAGNOSIS — M6281 Muscle weakness (generalized): Secondary | ICD-10-CM

## 2023-03-09 NOTE — Therapy (Signed)
[facility] OUTPATIENT PHYSICAL THERAPY UPPER EXTREMITY TREATMENT   Patient Name: JAURON FREERKSEN MRN: [id number] DOB:13-Feb-1981, 42 y.o., male Today's Date: 03/09/2023  END OF SESSION:   PT End of Session - 03/09/23 1112     Visit Number 2    Number of Visits 6    Authorization Type U[employer]- other    PT Start Time 1105    PT Stop Time 1150    PT Time Calculation (min) 45 min    Activity Tolerance Patient tolerated treatment well    Behavior During Therapy WFL for tasks assessed/performed            Past Medical History:  Diagnosis Date   Complication of anesthesia    "extra thirsty-a little upset"   Headache    History of kidney stones    possible one stone   Inguinal hernia    bilateral   Past Surgical History:  Procedure Laterality Date   FRACTURE SURGERY Right ~18 years ago   hand, removed knuckle   INGUINAL HERNIA REPAIR Bilateral 08/14/2014   Procedure: LAPAROSCOPIC BILATERAL INGUINAL HERNIA REPAIR;  Surgeon: [name], MD;  Location: [facility];  Service: General;  Laterality: Bilateral;   INSERTION OF MESH Bilateral 08/14/2014   Procedure: INSERTION OF MESH;  Surgeon: [name], MD;  Location: [facility];  Service: General;  Laterality: Bilateral;   WISDOM TOOTH EXTRACTION     Patient Active Problem List   Diagnosis Date Noted   Dizziness 01/17/2023   TOBACCO DEPENDENCE 05/24/2006    PCP: [facility] - General   REFERRING PROVIDER:   Windell Norfolk, MD    REFERRING DIAG:  Diagnosis  M54.2 (ICD-10-CM) - Cervicalgia  [phone] (ICD-10-CM) - Muscle spasms of neck    THERAPY DIAG:  Abnormal posture  Muscle weakness (generalized)  Neck muscle spasm  Cervicalgia  Rationale for Evaluation and Treatment: Rehabilitation  ONSET DATE: 1 year +  SUBJECTIVE:                                                                                                                                                                                       SUBJECTIVE STATEMENT: Patient does not have any pain at the moment but just a little soreness on B upper back.  EVAL: Patient has had pre syncopal episodes since last year; 10+ episodes over the last year with subsequent hospital admissions. Patient states it happens 1x/ month where he experiences pre syncope symptoms such as loss of bilateral vision, weakness, sweating but reports no dizziness when episodes happen. Happens ~1x/ month at random times. In the last few weeks, MD visits show the following: +  ANA blood test only, cardiac (-), upper GI/GERD (-). Patient is currently working with Neuro MD for work-up. No imaging of vertbral arteries done yet, no MRI yet.  Hand dominance: Right  PERTINENT HISTORY:  Pre syncope episodes 10+ Smoking  Bilateral hernia surgery Right wrist surgery   PAIN:  Are you having pain? Yes: NPRS scale: 3/10 Pain location: left upper cervical  Pain description: burning  Aggravating factors: varies Relieving factors: rest   PRECAUTIONS: None  RED FLAGS: None   WEIGHT BEARING RESTRICTIONS: No  FALLS:  Has patient fallen in last 6 months? No  OCCUPATION: [employer]   PLOF: Independent  PATIENT GOALS: To   NEXT MD VISIT: TBD  OBJECTIVE:   DIAGNOSTIC FINDINGS:  US soft tissue head/ neck  IMPRESSION: No mass identified by ultrasound. Is the patient's palpable abnormality enlarges or becomes painful, recommend CT of the neck with contrast to further evaluate.    COGNITION: Overall cognitive status: Within functional limits for tasks assessed     SENSATION: WFL  POSTURE: Moderate TH kyphosis, moderate forward head posture   CERVICAL ROM:   Active ROM A/PROM (deg) eval  Flexion 1 finger from chest   Extension WFL  Right lateral flexion 50%  Left lateral flexion 50%  Right rotation 75%  Left rotation 65%   (Blank rows = not tested)  UPPER EXTREMITY ROM:   Active ROM Right eval Left eval  Shoulder flexion [facility]  Shoulder  extension    Shoulder abduction    Shoulder adduction    Functional internal rotation T12 T10  Functional external rotation tightness Tightness   Elbow flexion    Elbow extension    Wrist flexion    Wrist extension    Wrist ulnar deviation    Wrist radial deviation    Wrist pronation    Wrist supination    (Blank rows = not tested; * = limited by pain )    UPPER EXTREMITY MMT:   MMT Right eval Left eval  Shoulder flexion 3+/5 3+/5  Shoulder extension    Shoulder abduction    Shoulder adduction    Shoulder internal rotation 3/5 3/5  Shoulder external rotation 3/5 3/5  Middle trapezius    Lower trapezius    Elbow flexion    Elbow extension    Wrist flexion    Wrist extension    Wrist ulnar deviation    Wrist radial deviation    Wrist pronation    Wrist supination    (Blank rows = not tested; * = limited by pain )   CERVICAL SPECIAL TESTS:  Sharp Purser Test: (-) Alar Ligament Test: (-) Distraction test: Negative   JOINT MOBILITY TESTING:  Limited Cervical sidebend to the right + left cervical rotation; left opening restriction   PALPATION:   Moderate tenderness to palpation left > right suboccipital, upper trap     TODAY'S TREATMENT:  DATE:  03/09/23 UBE, level 1, forward, 5' STM on B upper trapezius x 8' Supine chin tucks x 3" x 10 x 2 Supine cervical rot B x 3" x 10 on each Seated self-SNAGs rot with a towel x 3" x 5 on each Seated upper trapezius stretch x 30" x 2 Seated levator scapulae stretch x 30" x 3 Seated shoulder rolls, front/back x 10 each Scapular retraction with ER, RTB x 13" x 10 Doorway pec stretch at 60 abd x 30" x 2  03/06/23 PT Initial Eval Supine manual therapy to cervical spine   PATIENT EDUCATION: Education details: postural control/dysfunction  Person educated: Patient and  Spouse Education method: Explanation Education comprehension: verbalized understanding  HOME EXERCISE PROGRAM: Access Code: [email or web address] URL: [email or web address] Date: 03/09/2023 Prepared by: Krystal Clark  Exercises - Supine Cervical Rotation AROM on Pillow  - 1 x daily - 5 x weekly - 10 reps - 3 hold - Supine Chin Tuck  - 1 x daily - 5 x weekly - 10 reps - 3 hold - Seated Upper Trapezius Stretch  - 1 x daily - 5 x weekly - 3 reps - 30 hold - Seated Levator Scapulae Stretch  - 1 x daily - 5 x weekly - 3 reps - 30 hold - Shoulder Rolls in Sitting  - 1 x daily - 5 x weekly - 2 sets - 10 reps - Shoulder External Rotation and Scapular Retraction with Resistance  - 1 x daily - 5 x weekly - 2 sets - 10 reps - 3 hold - Doorway Pec Stretch at 60 Degrees Abduction with Arm Straight  - 1 x daily - 5 x weekly - 3 reps - 30 hold - Seated Assisted Cervical Rotation with Towel  - 1 x daily - 5 x weekly - 10 reps - 3 hold  ASSESSMENT:   CLINICAL IMPRESSION: Interventions today were geared towards cervical strengthening, flexibility and postural re-education. Tolerated all activities without worsening of symptoms. Demonstrated appropriate levels of fatigue. Provided slight amount of multimodal cueing to ensure correct execution of activity with good carry-over. To date, skilled PT is required to address the impairments and improve function.  EVAL: Patient is a 42 y.o. male who was seen today for physical therapy evaluation and treatment for neck pain/ cervicalgia due to poor posture. Patient presents to PT with the following objective impairments: decreased activity tolerance, decreased endurance, decreased ROM, decreased strength, hypomobility, increased fascial restrictions, impaired flexibility, postural dysfunction, and pain. These impairments limit the patient in activities such as carrying, lifting, reach over head, and hygiene/grooming. These impairments also limit the patient in  participation such as meal prep, cleaning, laundry, personal finances, interpersonal relationship, driving, community activity, occupation, and yard work. The patient will benefit from PT to address the limitations/impairments listed below to return to their prior level of function in the domains of activity and participation.   PERSONAL FACTORS: 1 comorbidity: PMH smoking  are also affecting patient's functional outcome.   REHAB POTENTIAL: Good  CLINICAL DECISION MAKING: Stable/uncomplicated  EVALUATION COMPLEXITY: Low  GOALS: Goals reviewed with patient? No  SHORT TERM GOALS: Target date: 3 sessions    Patient will be independent with a basic stretching/strengthening HEP  Baseline: Goal status: INITIAL   2.  Patient will be able to lift an object of 10lbs from a hip height off of a stable surface with 1-2/10 pain to facilitate ADL completion and self-care  Baseline:  Goal status: INITIAL   LONG TERM GOALS: Target date:  6 sessions   Patient will be independent with a comprehensive strengthening HEP  Baseline:  Goal status: INITIAL   2.  Patient will be able to lift an object of 10lbs overhead with 1-2/10 pain to facilitate ADL completion and self-care  Baseline:  Goal status: INITIAL  4. Patient will increase cervical ROM by 5 degrees into bilateral rotation, lateral flexion to improve ADL completion and self-care Baseline:  Goal status: INITIAL   PLAN: PT FREQUENCY: 1-2x/week  PT DURATION:  6 sessions  PLANNED INTERVENTIONS: 97110-Therapeutic exercises, 97530- Therapeutic activity, 97112- Neuromuscular re-education, 97535- Self Care, 16109- Manual therapy, 97014- Electrical stimulation (unattended), [phone]- Electrical stimulation (manual), Patient/Family education, Taping, Dry Needling, Joint mobilization, Joint manipulation, Spinal manipulation, Spinal mobilization, Vestibular training, Cryotherapy, and Moist heat  PLAN FOR NEXT SESSION: Manual therapy to cervical  then Progress cervical active range of motion, shoulder strength, add to HEP.    Tish Frederickson. Antonea Gaut, PT, DPT, OCS Board-Certified Clinical Specialist in Orthopedic PT PT Compact Privilege # ([place]): [id number] T 03/09/2023, 11:54 AM

## 2023-03-12 ENCOUNTER — Ambulatory Visit (HOSPITAL_COMMUNITY): Payer: 59 | Admitting: Physical Therapy

## 2023-03-12 DIAGNOSIS — R293 Abnormal posture: Secondary | ICD-10-CM | POA: Diagnosis not present

## 2023-03-12 DIAGNOSIS — M542 Cervicalgia: Secondary | ICD-10-CM

## 2023-03-12 DIAGNOSIS — M62838 Other muscle spasm: Secondary | ICD-10-CM

## 2023-03-12 DIAGNOSIS — M6281 Muscle weakness (generalized): Secondary | ICD-10-CM

## 2023-03-12 NOTE — Therapy (Signed)
[facility] OUTPATIENT PHYSICAL THERAPY UPPER EXTREMITY TREATMENT   Patient Name: Henry Peck MRN: [id number] DOB:04-30-1980, 42 y.o., male Today's Date: 03/12/2023  END OF SESSION:   PT End of Session - 03/12/23 1603     Visit Number 3    Number of Visits 6    Authorization Type UHC- other    PT Start Time 1348    PT Stop Time 1430    PT Time Calculation (min) 42 min    Activity Tolerance Patient tolerated treatment well    Behavior During Therapy WFL for tasks assessed/performed             Past Medical History:  Diagnosis Date   Complication of anesthesia    "extra thirsty-a little upset"   Headache    History of kidney stones    possible one stone   Inguinal hernia    bilateral   Past Surgical History:  Procedure Laterality Date   FRACTURE SURGERY Right ~18 years ago   hand, removed knuckle   INGUINAL HERNIA REPAIR Bilateral 08/14/2014   Procedure: LAPAROSCOPIC BILATERAL INGUINAL HERNIA REPAIR;  Surgeon: [name], MD;  Location: [facility];  Service: General;  Laterality: Bilateral;   INSERTION OF MESH Bilateral 08/14/2014   Procedure: INSERTION OF MESH;  Surgeon: [name], MD;  Location: [facility];  Service: General;  Laterality: Bilateral;   WISDOM TOOTH EXTRACTION     Patient Active Problem List   Diagnosis Date Noted   Dizziness 01/17/2023   TOBACCO DEPENDENCE 05/24/2006    PCP: [facility] - General   REFERRING PROVIDER:   Windell Norfolk, MD    REFERRING DIAG:  Diagnosis  M54.2 (ICD-10-CM) - Cervicalgia  [phone] (ICD-10-CM) - Muscle spasms of neck    THERAPY DIAG:  Abnormal posture  Muscle weakness (generalized)  Neck muscle spasm  Cervicalgia  Rationale for Evaluation and Treatment: Rehabilitation  ONSET DATE: 1 year +  SUBJECTIVE:                                                                                                                                                                                       SUBJECTIVE STATEMENT: Patient reports compliance with HEP.  Still having discomfort in his shoulders.  EVAL: Patient has had pre syncopal episodes since last year; 10+ episodes over the last year with subsequent hospital admissions. Patient states it happens 1x/ month where he experiences pre syncope symptoms such as loss of bilateral vision, weakness, sweating but reports no dizziness when episodes happen. Happens ~1x/ month at random times. In the last few weeks, MD visits show the following: + ANA blood test only, cardiac (-),  upper GI/GERD (-). Patient is currently working with Neuro MD for work-up. No imaging of vertbral arteries done yet, no MRI yet.  Hand dominance: Right  PERTINENT HISTORY:  Pre syncope episodes 10+ Smoking  Bilateral hernia surgery Right wrist surgery   PAIN:  Are you having pain? Yes: NPRS scale: 3/10 Pain location: left upper cervical  Pain description: burning  Aggravating factors: varies Relieving factors: rest   PRECAUTIONS: None  RED FLAGS: None   WEIGHT BEARING RESTRICTIONS: No  FALLS:  Has patient fallen in last 6 months? No  OCCUPATION: [employer]   PLOF: Independent  PATIENT GOALS: To   NEXT MD VISIT: TBD  OBJECTIVE:   DIAGNOSTIC FINDINGS:  US soft tissue head/ neck  IMPRESSION: No mass identified by ultrasound. Is the patient's palpable abnormality enlarges or becomes painful, recommend CT of the neck with contrast to further evaluate.    COGNITION: Overall cognitive status: Within functional limits for tasks assessed     SENSATION: WFL  POSTURE: Moderate TH kyphosis, moderate forward head posture   CERVICAL ROM:   Active ROM A/PROM (deg) eval  Flexion 1 finger from chest   Extension WFL  Right lateral flexion 50%  Left lateral flexion 50%  Right rotation 75%  Left rotation 65%   (Blank rows = not tested)  UPPER EXTREMITY ROM:   Active ROM Right eval Left eval  Shoulder flexion [facility]  Shoulder  extension    Shoulder abduction    Shoulder adduction    Functional internal rotation T12 T10  Functional external rotation tightness Tightness   Elbow flexion    Elbow extension    Wrist flexion    Wrist extension    Wrist ulnar deviation    Wrist radial deviation    Wrist pronation    Wrist supination    (Blank rows = not tested; * = limited by pain )    UPPER EXTREMITY MMT:   MMT Right eval Left eval  Shoulder flexion 3+/5 3+/5  Shoulder extension    Shoulder abduction    Shoulder adduction    Shoulder internal rotation 3/5 3/5  Shoulder external rotation 3/5 3/5  Middle trapezius    Lower trapezius    Elbow flexion    Elbow extension    Wrist flexion    Wrist extension    Wrist ulnar deviation    Wrist radial deviation    Wrist pronation    Wrist supination    (Blank rows = not tested; * = limited by pain )   CERVICAL SPECIAL TESTS:  Sharp Purser Test: (-) Alar Ligament Test: (-) Distraction test: Negative   JOINT MOBILITY TESTING:  Limited Cervical sidebend to the right + left cervical rotation; left opening restriction   PALPATION:   Moderate tenderness to palpation left > right suboccipital, upper trap     TODAY'S TREATMENT:  DATE:  03/12/23 Manual to bil Upper traps in seated Seated cervical ROM full without issues UT stretch bil 2X30" Levator stretch bil 2X30" Doorway stretch 2X30" Prone extension to encourage thoracic extension 2 minutes Seated thoracic extension against chair (demonstration) Standing GTB scap retraction 10X  GTB rows 10X  GTB extension 10X  03/09/23 UBE, level 1, forward, 5' STM on B upper trapezius x 8' Supine chin tucks x 3" x 10 x 2 Supine cervical rot B x 3" x 10 on each Seated self-SNAGs rot with a towel x 3" x 5 on each Seated upper trapezius stretch x 30" x 2 Seated levator  scapulae stretch x 30" x 3 Seated shoulder rolls, front/back x 10 each Scapular retraction with ER, RTB x 13" x 10 Doorway pec stretch at 60 abd x 30" x 2  03/06/23 PT Initial Eval Supine manual therapy to cervical spine   PATIENT EDUCATION: Education details: postural control/dysfunction  Person educated: Patient and Spouse Education method: Explanation Education comprehension: verbalized understanding  HOME EXERCISE PROGRAM: Access Code: [email or web address] URL: [email or web address] Date: 03/09/2023 Prepared by: Krystal Clark  Exercises - Supine Cervical Rotation AROM on Pillow  - 1 x daily - 5 x weekly - 10 reps - 3 hold - Supine Chin Tuck  - 1 x daily - 5 x weekly - 10 reps - 3 hold - Seated Upper Trapezius Stretch  - 1 x daily - 5 x weekly - 3 reps - 30 hold - Seated Levator Scapulae Stretch  - 1 x daily - 5 x weekly - 3 reps - 30 hold - Shoulder Rolls in Sitting  - 1 x daily - 5 x weekly - 2 sets - 10 reps - Shoulder External Rotation and Scapular Retraction with Resistance  - 1 x daily - 5 x weekly - 2 sets - 10 reps - 3 hold - Doorway Pec Stretch at 60 Degrees Abduction with Arm Straight  - 1 x daily - 5 x weekly - 3 reps - 30 hold - Seated Assisted Cervical Rotation with Towel  - 1 x daily - 5 x weekly - 10 reps - 3 hold  03/12/23:  postural green theraband scap retraction, rows, shoulder extensions   ASSESSMENT:   CLINICAL IMPRESSION: Began session with manual first per PT POC then proceeded with therex.  Pt with small spasm LT upper trap and scap region but overall with minimal tightness present bilaterally.  Exercises were focused on cervical ROM as well as postural strengthening. Encouraged to complete more extension based exercises, I.e. extension against chair or prone on elbows.  Cues to relax levator mm with activities, especially therabands.  Added these and given for home to target weak postural mm/.  PT Tolerated all activities without worsening of  symptoms. Demonstrated appropriate levels of fatigue. PT will continue to benefit from skilled therapy to address his impairments and improve function.  EVAL: Patient is a 42 y.o. male who was seen today for physical therapy evaluation and treatment for neck pain/ cervicalgia due to poor posture. Patient presents to PT with the following objective impairments: decreased activity tolerance, decreased endurance, decreased ROM, decreased strength, hypomobility, increased fascial restrictions, impaired flexibility, postural dysfunction, and pain. These impairments limit the patient in activities such as carrying, lifting, reach over head, and hygiene/grooming. These impairments also limit the patient in participation such as meal prep, cleaning, laundry, personal finances, interpersonal relationship, driving, community activity, occupation, and yard work. The patient will benefit from PT to address the limitations/impairments listed  below to return to their prior level of function in the domains of activity and participation.   PERSONAL FACTORS: 1 comorbidity: PMH smoking  are also affecting patient's functional outcome.   REHAB POTENTIAL: Good  CLINICAL DECISION MAKING: Stable/uncomplicated  EVALUATION COMPLEXITY: Low  GOALS: Goals reviewed with patient? No  SHORT TERM GOALS: Target date: 3 sessions    Patient will be independent with a basic stretching/strengthening HEP  Baseline: Goal status: INITIAL   2.  Patient will be able to lift an object of 10lbs from a hip height off of a stable surface with 1-2/10 pain to facilitate ADL completion and self-care  Baseline:  Goal status: INITIAL   LONG TERM GOALS: Target date: 6 sessions   Patient will be independent with a comprehensive strengthening HEP  Baseline:  Goal status: INITIAL   2.  Patient will be able to lift an object of 10lbs overhead with 1-2/10 pain to facilitate ADL completion and self-care  Baseline:  Goal status:  INITIAL  4. Patient will increase cervical ROM by 5 degrees into bilateral rotation, lateral flexion to improve ADL completion and self-care Baseline:  Goal status: INITIAL   PLAN: PT FREQUENCY: 1-2x/week  PT DURATION:  6 sessions  PLANNED INTERVENTIONS: 97110-Therapeutic exercises, 97530- Therapeutic activity, 97112- Neuromuscular re-education, 97535- Self Care, 45409- Manual therapy, 97014- Electrical stimulation (unattended), [phone]- Electrical stimulation (manual), Patient/Family education, Taping, Dry Needling, Joint mobilization, Joint manipulation, Spinal manipulation, Spinal mobilization, Vestibular training, Cryotherapy, and Moist heat  PLAN FOR NEXT SESSION: Manual therapy to cervical then Progress cervical active range of motion, shoulder strength, add to HEP.    Lurena Nida, PTA/CLT [facility] Ph: [phone]  03/12/2023, 4:05 PM

## 2023-03-15 ENCOUNTER — Encounter (HOSPITAL_COMMUNITY): Payer: Self-pay

## 2023-03-15 ENCOUNTER — Ambulatory Visit (HOSPITAL_COMMUNITY): Payer: 59

## 2023-03-15 DIAGNOSIS — R293 Abnormal posture: Secondary | ICD-10-CM

## 2023-03-15 DIAGNOSIS — M542 Cervicalgia: Secondary | ICD-10-CM

## 2023-03-15 DIAGNOSIS — M6281 Muscle weakness (generalized): Secondary | ICD-10-CM

## 2023-03-15 DIAGNOSIS — M62838 Other muscle spasm: Secondary | ICD-10-CM

## 2023-03-15 NOTE — Therapy (Signed)
[facility] OUTPATIENT PHYSICAL THERAPY UPPER EXTREMITY TREATMENT   Patient Name: Henry Peck MRN: [id number] DOB:1980/04/28, 42 y.o., male Today's Date: 03/15/2023  END OF SESSION:   PT End of Session - 03/15/23 1518     Visit Number 4    Number of Visits 6    Date for PT Re-Evaluation 04/17/23    Authorization Type UHC- other    PT Start Time 1520    PT Stop Time 1605    PT Time Calculation (min) 45 min    Activity Tolerance Patient tolerated treatment well    Behavior During Therapy WFL for tasks assessed/performed             Past Medical History:  Diagnosis Date   Complication of anesthesia    "extra thirsty-a little upset"   Headache    History of kidney stones    possible one stone   Inguinal hernia    bilateral   Past Surgical History:  Procedure Laterality Date   FRACTURE SURGERY Right ~18 years ago   hand, removed knuckle   INGUINAL HERNIA REPAIR Bilateral 08/14/2014   Procedure: LAPAROSCOPIC BILATERAL INGUINAL HERNIA REPAIR;  Surgeon: [name], MD;  Location: [facility];  Service: General;  Laterality: Bilateral;   INSERTION OF MESH Bilateral 08/14/2014   Procedure: INSERTION OF MESH;  Surgeon: [name], MD;  Location: [facility];  Service: General;  Laterality: Bilateral;   WISDOM TOOTH EXTRACTION     Patient Active Problem List   Diagnosis Date Noted   Dizziness 01/17/2023   TOBACCO DEPENDENCE 05/24/2006    PCP: [facility] - General   REFERRING PROVIDER:   Windell Norfolk, MD    REFERRING DIAG:  Diagnosis  M54.2 (ICD-10-CM) - Cervicalgia  [phone] (ICD-10-CM) - Muscle spasms of neck    THERAPY DIAG:  Abnormal posture  Muscle weakness (generalized)  Neck muscle spasm  Cervicalgia  Rationale for Evaluation and Treatment: Rehabilitation  ONSET DATE: 1 year +  SUBJECTIVE:                                                                                                                                                                                       SUBJECTIVE STATEMENT: Reports has headache since yesterday, reports some improvements today.  Reports overall tightness in mid back with pressure points inside shoulder blades.  Reports compliance with HEP, most difficulty with prone on elbows  EVAL: Patient has had pre syncopal episodes since last year; 10+ episodes over the last year with subsequent hospital admissions. Patient states it happens 1x/ month where he experiences pre syncope symptoms such as loss of bilateral vision, weakness, sweating  but reports no dizziness when episodes happen. Happens ~1x/ month at random times. In the last few weeks, MD visits show the following: + ANA blood test only, cardiac (-), upper GI/GERD (-). Patient is currently working with Neuro MD for work-up. No imaging of vertbral arteries done yet, no MRI yet.  Hand dominance: Right  PERTINENT HISTORY:  Pre syncope episodes 10+ Smoking  Bilateral hernia surgery Right wrist surgery   PAIN:  Are you having pain? Yes: NPRS scale: 0 no pain, tightness/10 Pain location: left upper cervical  Pain description: burning  Aggravating factors: varies Relieving factors: rest   PRECAUTIONS: None  RED FLAGS: None   WEIGHT BEARING RESTRICTIONS: No  FALLS:  Has patient fallen in last 6 months? No  OCCUPATION: [employer]   PLOF: Independent  PATIENT GOALS: To   NEXT MD VISIT: Saw PCP 03/15/23, no apt yet with referring MD  OBJECTIVE:   DIAGNOSTIC FINDINGS:  US soft tissue head/ neck  IMPRESSION: No mass identified by ultrasound. Is the patient's palpable abnormality enlarges or becomes painful, recommend CT of the neck with contrast to further evaluate.    COGNITION: Overall cognitive status: Within functional limits for tasks assessed     SENSATION: WFL  POSTURE: Moderate TH kyphosis, moderate forward head posture   CERVICAL ROM:   Active ROM A/PROM (deg) eval  Flexion 1 finger from chest    Extension WFL  Right lateral flexion 50%  Left lateral flexion 50%  Right rotation 75%  Left rotation 65%   (Blank rows = not tested)  UPPER EXTREMITY ROM:   Active ROM Right eval Left eval  Shoulder flexion [facility]  Shoulder extension    Shoulder abduction    Shoulder adduction    Functional internal rotation T12 T10  Functional external rotation tightness Tightness   Elbow flexion    Elbow extension    Wrist flexion    Wrist extension    Wrist ulnar deviation    Wrist radial deviation    Wrist pronation    Wrist supination    (Blank rows = not tested; * = limited by pain )    UPPER EXTREMITY MMT:   MMT Right eval Left eval  Shoulder flexion 3+/5 3+/5  Shoulder extension    Shoulder abduction    Shoulder adduction    Shoulder internal rotation 3/5 3/5  Shoulder external rotation 3/5 3/5  Middle trapezius    Lower trapezius    Elbow flexion    Elbow extension    Wrist flexion    Wrist extension    Wrist ulnar deviation    Wrist radial deviation    Wrist pronation    Wrist supination    (Blank rows = not tested; * = limited by pain )   CERVICAL SPECIAL TESTS:  Sharp Purser Test: (-) Alar Ligament Test: (-) Distraction test: Negative   JOINT MOBILITY TESTING:  Limited Cervical sidebend to the right + left cervical rotation; left opening restriction   PALPATION:   Moderate tenderness to palpation left > right suboccipital, upper trap     TODAY'S TREATMENT:  DATE:  03/15/23 3D thoracic excursion 10x  Standing: GTB lower trap pull down POE with pillow under hips Manual prone to periscapular mm, supine UT, suboccipital release UT Strech 2x 30" Levator stretch 2x 30"  Discussed seated posture, lumbar support   03/12/23 Manual to bil Upper traps in seated Seated cervical ROM full without issues UT stretch  bil 2X30" Levator stretch bil 2X30" Doorway stretch 2X30" Prone extension to encourage thoracic extension 2 minutes Seated thoracic extension against chair (demonstration) Standing GTB scap retraction 10X  GTB rows 10X  GTB extension 10X  03/09/23 UBE, level 1, forward, 5' STM on B upper trapezius x 8' Supine chin tucks x 3" x 10 x 2 Supine cervical rot B x 3" x 10 on each Seated self-SNAGs rot with a towel x 3" x 5 on each Seated upper trapezius stretch x 30" x 2 Seated levator scapulae stretch x 30" x 3 Seated shoulder rolls, front/back x 10 each Scapular retraction with ER, RTB x 13" x 10 Doorway pec stretch at 60 abd x 30" x 2  03/06/23 PT Initial Eval Supine manual therapy to cervical spine   PATIENT EDUCATION: Education details: postural control/dysfunction  Person educated: Patient and Spouse Education method: Explanation Education comprehension: verbalized understanding  HOME EXERCISE PROGRAM: Access Code: [email or web address] URL: [email or web address] Date: 03/09/2023 Prepared by: Krystal Clark  Exercises - Supine Cervical Rotation AROM on Pillow  - 1 x daily - 5 x weekly - 10 reps - 3 hold - Supine Chin Tuck  - 1 x daily - 5 x weekly - 10 reps - 3 hold - Seated Upper Trapezius Stretch  - 1 x daily - 5 x weekly - 3 reps - 30 hold - Seated Levator Scapulae Stretch  - 1 x daily - 5 x weekly - 3 reps - 30 hold - Shoulder Rolls in Sitting  - 1 x daily - 5 x weekly - 2 sets - 10 reps - Shoulder External Rotation and Scapular Retraction with Resistance  - 1 x daily - 5 x weekly - 2 sets - 10 reps - 3 hold - Doorway Pec Stretch at 60 Degrees Abduction with Arm Straight  - 1 x daily - 5 x weekly - 3 reps - 30 hold - Seated Assisted Cervical Rotation with Towel  - 1 x daily - 5 x weekly - 10 reps - 3 hold  03/12/23:  postural green theraband scap retraction, rows, shoulder extensions   03/15/23:  GTB lower trap pull down  3D thoracic  excursion.  ASSESSMENT:   CLINICAL IMPRESSION: 03/15/23:  Educated use of pillow under hips with reports of improved comfort with POE exercise.  Added thoracic mobility exercises and continued with cervical stretches.  Added lower trap strengthening for postural strengthening and discussed importance of seated posture.  Pt shown lumbar support to improve seated posture with work duties.  Manual STM complete with noted 1 small spaspm UT and levator scapular with reports of improved following.  Encouraged hydration to reduce risk of headache following manual.    EVAL: Patient is a 42 y.o. male who was seen today for physical therapy evaluation and treatment for neck pain/ cervicalgia due to poor posture. Patient presents to PT with the following objective impairments: decreased activity tolerance, decreased endurance, decreased ROM, decreased strength, hypomobility, increased fascial restrictions, impaired flexibility, postural dysfunction, and pain. These impairments limit the patient in activities such as carrying, lifting, reach over head, and hygiene/grooming. These impairments also limit the patient in participation  such as meal prep, cleaning, laundry, personal finances, interpersonal relationship, driving, community activity, occupation, and yard work. The patient will benefit from PT to address the limitations/impairments listed below to return to their prior level of function in the domains of activity and participation.   PERSONAL FACTORS: 1 comorbidity: PMH smoking  are also affecting patient's functional outcome.   REHAB POTENTIAL: Good  CLINICAL DECISION MAKING: Stable/uncomplicated  EVALUATION COMPLEXITY: Low  GOALS: Goals reviewed with patient? No  SHORT TERM GOALS: Target date: 3 sessions    Patient will be independent with a basic stretching/strengthening HEP  Baseline: Goal status: INITIAL   2.  Patient will be able to lift an object of 10lbs from a hip height off of a  stable surface with 1-2/10 pain to facilitate ADL completion and self-care  Baseline:  Goal status: INITIAL   LONG TERM GOALS: Target date: 6 sessions   Patient will be independent with a comprehensive strengthening HEP  Baseline:  Goal status: INITIAL   2.  Patient will be able to lift an object of 10lbs overhead with 1-2/10 pain to facilitate ADL completion and self-care  Baseline:  Goal status: INITIAL  4. Patient will increase cervical ROM by 5 degrees into bilateral rotation, lateral flexion to improve ADL completion and self-care Baseline:  Goal status: INITIAL   PLAN: PT FREQUENCY: 1-2x/week  PT DURATION:  6 sessions  PLANNED INTERVENTIONS: 97110-Therapeutic exercises, 97530- Therapeutic activity, 97112- Neuromuscular re-education, 97535- Self Care, 29528- Manual therapy, 97014- Electrical stimulation (unattended), [phone]- Electrical stimulation (manual), Patient/Family education, Taping, Dry Needling, Joint mobilization, Joint manipulation, Spinal manipulation, Spinal mobilization, Vestibular training, Cryotherapy, and Moist heat  PLAN FOR NEXT SESSION: Manual therapy to cervical then Progress cervical active range of motion, shoulder strength, add to HEP.    Becky Sax, LPTA/CLT; CBIS [phone] Juel Burrow, PTA 03/15/2023, 4:28 PM   03/15/2023, 4:28 PM

## 2023-03-16 ENCOUNTER — Encounter: Payer: Self-pay | Admitting: Neurology

## 2023-03-22 ENCOUNTER — Ambulatory Visit (HOSPITAL_COMMUNITY): Payer: 59

## 2023-03-22 DIAGNOSIS — M6281 Muscle weakness (generalized): Secondary | ICD-10-CM

## 2023-03-22 DIAGNOSIS — R293 Abnormal posture: Secondary | ICD-10-CM

## 2023-03-22 DIAGNOSIS — M62838 Other muscle spasm: Secondary | ICD-10-CM

## 2023-03-22 DIAGNOSIS — M542 Cervicalgia: Secondary | ICD-10-CM

## 2023-03-22 NOTE — Therapy (Signed)
[facility] OUTPATIENT PHYSICAL THERAPY UPPER EXTREMITY TREATMENT   Patient Name: Henry Peck MRN: [id number] DOB:[birth year], 42 y.o., male Today's Date: 03/22/2023  END OF SESSION:   PT End of Session - 03/22/23 1433     Visit Number 5    Number of Visits 6    Date for PT Re-Evaluation 04/17/23    Authorization Type UHC- other    PT Start Time 1435    PT Stop Time 1515    PT Time Calculation (min) 40 min    Activity Tolerance Patient tolerated treatment well    Behavior During Therapy WFL for tasks assessed/performed              Past Medical History:  Diagnosis Date   Complication of anesthesia    "extra thirsty-a little upset"   Headache    History of kidney stones    possible one stone   Inguinal hernia    bilateral   Past Surgical History:  Procedure Laterality Date   FRACTURE SURGERY Right ~18 years ago   hand, removed knuckle   INGUINAL HERNIA REPAIR Bilateral 08/14/2014   Procedure: LAPAROSCOPIC BILATERAL INGUINAL HERNIA REPAIR;  Surgeon: [name], MD;  Location: [facility];  Service: General;  Laterality: Bilateral;   INSERTION OF MESH Bilateral 08/14/2014   Procedure: INSERTION OF MESH;  Surgeon: [name], MD;  Location: [facility];  Service: General;  Laterality: Bilateral;   WISDOM TOOTH EXTRACTION     Patient Active Problem List   Diagnosis Date Noted   Dizziness 01/17/2023   TOBACCO DEPENDENCE 05/24/2006    PCP: [facility] - General   REFERRING PROVIDER:   Windell Norfolk, MD    REFERRING DIAG:  Diagnosis  M54.2 (ICD-10-CM) - Cervicalgia  [phone] (ICD-10-CM) - Muscle spasms of neck    THERAPY DIAG:  Abnormal posture  Muscle weakness (generalized)  Neck muscle spasm  Cervicalgia  Rationale for Evaluation and Treatment: Rehabilitation  ONSET DATE: 1 year +  SUBJECTIVE:                                                                                                                                                                                       SUBJECTIVE STATEMENT: Denies any pain today. However, patient reports some lightheadedness yesterday when he was changing the bulb in the oven.   EVAL: Patient has had pre syncopal episodes since last year; 10+ episodes over the last year with subsequent hospital admissions. Patient states it happens 1x/ month where he experiences pre syncope symptoms such as loss of bilateral vision, weakness, sweating but reports no dizziness when episodes happen. Happens ~1x/ month at random  times. In the last few weeks, MD visits show the following: + ANA blood test only, cardiac (-), upper GI/GERD (-). Patient is currently working with Neuro MD for work-up. No imaging of vertbral arteries done yet, no MRI yet.  Hand dominance: Right  PERTINENT HISTORY:  Pre syncope episodes 10+ Smoking  Bilateral hernia surgery Right wrist surgery   PAIN:  Are you having pain? Yes: NPRS scale: 0 no pain, tightness/10 Pain location: left upper cervical  Pain description: burning  Aggravating factors: varies Relieving factors: rest   PRECAUTIONS: None  RED FLAGS: None   WEIGHT BEARING RESTRICTIONS: No  FALLS:  Has patient fallen in last 6 months? No  OCCUPATION: [employer]   PLOF: Independent  PATIENT GOALS: To   NEXT MD VISIT: Saw PCP 03/15/23, no apt yet with referring MD  OBJECTIVE:   DIAGNOSTIC FINDINGS:  US soft tissue head/ neck  IMPRESSION: No mass identified by ultrasound. Is the patient's palpable abnormality enlarges or becomes painful, recommend CT of the neck with contrast to further evaluate.    COGNITION: Overall cognitive status: Within functional limits for tasks assessed     SENSATION: WFL  POSTURE: Moderate TH kyphosis, moderate forward head posture   CERVICAL ROM:   Active ROM A/PROM (deg) eval  Flexion 1 finger from chest   Extension WFL  Right lateral flexion 50%  Left lateral flexion 50%  Right rotation 75%  Left  rotation 65%   (Blank rows = not tested)  UPPER EXTREMITY ROM:   Active ROM Right eval Left eval  Shoulder flexion [facility]  Shoulder extension    Shoulder abduction    Shoulder adduction    Functional internal rotation T12 T10  Functional external rotation tightness Tightness   Elbow flexion    Elbow extension    Wrist flexion    Wrist extension    Wrist ulnar deviation    Wrist radial deviation    Wrist pronation    Wrist supination    (Blank rows = not tested; * = limited by pain )    UPPER EXTREMITY MMT:   MMT Right eval Left eval  Shoulder flexion 3+/5 3+/5  Shoulder extension    Shoulder abduction    Shoulder adduction    Shoulder internal rotation 3/5 3/5  Shoulder external rotation 3/5 3/5  Middle trapezius    Lower trapezius    Elbow flexion    Elbow extension    Wrist flexion    Wrist extension    Wrist ulnar deviation    Wrist radial deviation    Wrist pronation    Wrist supination    (Blank rows = not tested; * = limited by pain )   CERVICAL SPECIAL TESTS:  Sharp Purser Test: (-) Alar Ligament Test: (-) Distraction test: Negative   JOINT MOBILITY TESTING:  Limited Cervical sidebend to the right + left cervical rotation; left opening restriction   PALPATION:   Moderate tenderness to palpation left > right suboccipital, upper trap     TODAY'S TREATMENT:  DATE:  03/22/23 STM on B paracervicals and upper trapezius x 8' Supine chin tucks with cervical retraction on a ball x 3" x 10 Supine cervical rot B with a ball x 3" x 10 on each Sidelying open book with cervical tracking x 10 on each Supine Self hugs with 1 pillow on back x 20 Seated self-SNAGs rot with a towel x 3" x 5 on each Seated upper trapezius stretch x 30" x 2 Seated levator scapulae stretch x 30" x 2 Rows, 3" x 10, blue TB  Horizontal abd x  3" x 10, blue TB  03/15/23 3D thoracic excursion 10x  Standing: GTB lower trap pull down POE with pillow under hips Manual prone to periscapular mm, supine UT, suboccipital release UT Strech 2x 30" Levator stretch 2x 30"  Discussed seated posture, lumbar support   03/12/23 Manual to bil Upper traps in seated Seated cervical ROM full without issues UT stretch bil 2X30" Levator stretch bil 2X30" Doorway stretch 2X30" Prone extension to encourage thoracic extension 2 minutes Seated thoracic extension against chair (demonstration) Standing GTB scap retraction 10X  GTB rows 10X  GTB extension 10X  03/09/23 UBE, level 1, forward, 5' STM on B upper trapezius x 8' Supine chin tucks x 3" x 10 x 2 Supine cervical rot B x 3" x 10 on each Seated self-SNAGs rot with a towel x 3" x 5 on each Seated upper trapezius stretch x 30" x 2 Seated levator scapulae stretch x 30" x 3 Seated shoulder rolls, front/back x 10 each Scapular retraction with ER, RTB x 13" x 10 Doorway pec stretch at 60 abd x 30" x 2  03/06/23 PT Initial Eval Supine manual therapy to cervical spine   PATIENT EDUCATION: Education details: postural control/dysfunction  Person educated: Patient and Spouse Education method: Explanation Education comprehension: verbalized understanding  HOME EXERCISE PROGRAM: Access Code: [email or web address] URL: [email or web address] Date: 03/09/2023 Prepared by: Krystal Clark  Exercises - Supine Cervical Rotation AROM on Pillow  - 1 x daily - 5 x weekly - 10 reps - 3 hold - Supine Chin Tuck  - 1 x daily - 5 x weekly - 10 reps - 3 hold - Seated Upper Trapezius Stretch  - 1 x daily - 5 x weekly - 3 reps - 30 hold - Seated Levator Scapulae Stretch  - 1 x daily - 5 x weekly - 3 reps - 30 hold - Shoulder Rolls in Sitting  - 1 x daily - 5 x weekly - 2 sets - 10 reps - Shoulder External Rotation and Scapular Retraction with Resistance  - 1 x daily - 5 x weekly - 2 sets - 10 reps  - 3 hold - Doorway Pec Stretch at 60 Degrees Abduction with Arm Straight  - 1 x daily - 5 x weekly - 3 reps - 30 hold - Seated Assisted Cervical Rotation with Towel  - 1 x daily - 5 x weekly - 10 reps - 3 hold  03/12/23:  postural green theraband scap retraction, rows, shoulder extensions   03/15/23:  GTB lower trap pull down  3D thoracic excursion.  ASSESSMENT:   CLINICAL IMPRESSION: Interventions today were geared towards cervical strengthening, flexibility and postural re-education. Tolerated all activities without worsening of symptoms. Demonstrated appropriate levels of fatigue. Provided slight amount of multimodal cueing to ensure correct execution of activity with good carry-over. To date, skilled PT is required to address the impairments and improve function.   EVAL: Patient is a 42 y.o. male  who was seen today for physical therapy evaluation and treatment for neck pain/ cervicalgia due to poor posture. Patient presents to PT with the following objective impairments: decreased activity tolerance, decreased endurance, decreased ROM, decreased strength, hypomobility, increased fascial restrictions, impaired flexibility, postural dysfunction, and pain. These impairments limit the patient in activities such as carrying, lifting, reach over head, and hygiene/grooming. These impairments also limit the patient in participation such as meal prep, cleaning, laundry, personal finances, interpersonal relationship, driving, community activity, occupation, and yard work. The patient will benefit from PT to address the limitations/impairments listed below to return to their prior level of function in the domains of activity and participation.   PERSONAL FACTORS: 1 comorbidity: PMH smoking  are also affecting patient's functional outcome.   REHAB POTENTIAL: Good  CLINICAL DECISION MAKING: Stable/uncomplicated  EVALUATION COMPLEXITY: Low  GOALS: Goals reviewed with patient? No  SHORT TERM GOALS:  Target date: 3 sessions    Patient will be independent with a basic stretching/strengthening HEP  Baseline: Goal status: INITIAL   2.  Patient will be able to lift an object of 10lbs from a hip height off of a stable surface with 1-2/10 pain to facilitate ADL completion and self-care  Baseline:  Goal status: INITIAL   LONG TERM GOALS: Target date: 6 sessions   Patient will be independent with a comprehensive strengthening HEP  Baseline:  Goal status: INITIAL   2.  Patient will be able to lift an object of 10lbs overhead with 1-2/10 pain to facilitate ADL completion and self-care  Baseline:  Goal status: INITIAL  4. Patient will increase cervical ROM by 5 degrees into bilateral rotation, lateral flexion to improve ADL completion and self-care Baseline:  Goal status: INITIAL   PLAN: PT FREQUENCY: 1-2x/week  PT DURATION:  6 sessions  PLANNED INTERVENTIONS: 97110-Therapeutic exercises, 97530- Therapeutic activity, 97112- Neuromuscular re-education, 97535- Self Care, 16109- Manual therapy, 97014- Electrical stimulation (unattended), [phone]- Electrical stimulation (manual), Patient/Family education, Taping, Dry Needling, Joint mobilization, Joint manipulation, Spinal manipulation, Spinal mobilization, Vestibular training, Cryotherapy, and Moist heat  PLAN FOR NEXT SESSION: Manual therapy to cervical then Progress cervical active range of motion, shoulder strength, add to HEP. Re-assess next visit   Iantha Fallen L. Billey Wojciak, PT, DPT, OCS Board-Certified Clinical Specialist in Orthopedic PT PT Compact Privilege # ([place]): [id number] T 03/22/2023, 3:18 PM

## 2023-03-26 ENCOUNTER — Ambulatory Visit (HOSPITAL_COMMUNITY): Payer: 59

## 2023-03-26 DIAGNOSIS — M542 Cervicalgia: Secondary | ICD-10-CM

## 2023-03-26 DIAGNOSIS — M6281 Muscle weakness (generalized): Secondary | ICD-10-CM

## 2023-03-26 DIAGNOSIS — R293 Abnormal posture: Secondary | ICD-10-CM | POA: Diagnosis not present

## 2023-03-26 DIAGNOSIS — M62838 Other muscle spasm: Secondary | ICD-10-CM

## 2023-03-26 NOTE — Therapy (Signed)
Marland Kitchen OUTPATIENT PHYSICAL THERAPY UPPER EXTREMITY TREATMENT  PHYSICAL THERAPY DISCHARGE SUMMARY  Visits from Start of Care: 6  Current functional level related to goals / functional outcomes: See below   Remaining deficits: See below   Education / Equipment: N/a   Patient agrees to discharge. Patient goals were met. Patient is being discharged due to meeting the stated rehab goals.     Patient Name: Henry Peck MRN: [id number] DOB:01/22/1981, 42 y.o., male Today's Date: 03/26/2023  END OF SESSION:   PT End of Session - 03/26/23 1436     Visit Number 6    Number of Visits 6    Date for PT Re-Evaluation 04/17/23    Authorization Type [employer]- other    PT Start Time 0230    PT Stop Time 0310    PT Time Calculation (min) 40 min    Activity Tolerance Patient tolerated treatment well    Behavior During Therapy [facility] for tasks assessed/performed              Past Medical History:  Diagnosis Date   Complication of anesthesia    "extra thirsty-a little upset"   Headache    History of kidney stones    possible one stone   Inguinal hernia    bilateral   Past Surgical History:  Procedure Laterality Date   FRACTURE SURGERY Right ~18 years ago   hand, removed knuckle   INGUINAL HERNIA REPAIR Bilateral 08/14/2014   Procedure: LAPAROSCOPIC BILATERAL INGUINAL HERNIA REPAIR;  Surgeon: [name], MD;  Location: [facility];  Service: General;  Laterality: Bilateral;   INSERTION OF MESH Bilateral 08/14/2014   Procedure: INSERTION OF MESH;  Surgeon: [name], MD;  Location: [facility];  Service: General;  Laterality: Bilateral;   WISDOM TOOTH EXTRACTION     Patient Active Problem List   Diagnosis Date Noted   Dizziness 01/17/2023   TOBACCO DEPENDENCE 05/24/2006    PCP: [facility] - [facility]   REFERRING PROVIDER:   Windell Norfolk, MD    REFERRING DIAG:  Diagnosis  M54.2 (ICD-10-CM) - Cervicalgia  [phone] (ICD-10-CM) - Muscle spasms of  neck    THERAPY DIAG:  Abnormal posture  Muscle weakness (generalized)  Neck muscle spasm  Cervicalgia  Rationale for Evaluation and Treatment: Rehabilitation  ONSET DATE: 1 year +  SUBJECTIVE:                                                                                                                                                                                      SUBJECTIVE STATEMENT: Patient states he had 2x pre syncope episode the week of Christmas. Patient states  he has been doing HEP and sees no improvements in symptoms. Patient wants to return to MD for further testing    EVAL: Patient has had pre syncopal episodes since last year; 10+ episodes over the last year with subsequent hospital admissions. Patient states it happens 1x/ month where he experiences pre syncope symptoms such as loss of bilateral vision, weakness, sweating but reports no dizziness when episodes happen. Happens ~1x/ month at random times. In the last few weeks, MD visits show the following: + ANA blood test only, cardiac (-), upper GI/GERD (-). Patient is currently working with Neuro MD for work-up. No imaging of vertbral arteries done yet, no MRI yet.  Hand dominance: Right  PERTINENT HISTORY:  Pre syncope episodes 10+ Smoking  Bilateral hernia surgery Right wrist surgery   PAIN:  Are you having pain? Yes: NPRS scale: 0 no pain, tightness/10 Pain location: left upper cervical  Pain description: burning  Aggravating factors: varies Relieving factors: rest   PRECAUTIONS: None  RED FLAGS: None   WEIGHT BEARING RESTRICTIONS: No  FALLS:  Has patient fallen in last 6 months? No  OCCUPATION: [employer]   PLOF: Independent  PATIENT GOALS: To   NEXT MD VISIT: Saw PCP 03/15/23, no apt yet with referring MD  OBJECTIVE:   DIAGNOSTIC FINDINGS:  US soft tissue head/ neck  IMPRESSION: No mass identified by ultrasound. Is the patient's palpable abnormality enlarges or becomes painful,  recommend CT of the neck with contrast to further evaluate.    COGNITION: Overall cognitive status: Within functional limits for tasks assessed     SENSATION: WFL  POSTURE: Moderate TH kyphosis, moderate forward head posture   CERVICAL ROM:   Active ROM A/PROM (deg) eval  Flexion 1 finger from chest   Extension WFL  Right lateral flexion 50%  Left lateral flexion 50%  Right rotation 75%  Left rotation 65%   (Blank rows = not tested)  UPPER EXTREMITY ROM:   Active ROM Right eval Left eval  Shoulder flexion [facility]  Shoulder extension    Shoulder abduction    Shoulder adduction    Functional internal rotation T12 T10  Functional external rotation tightness Tightness   Elbow flexion    Elbow extension    Wrist flexion    Wrist extension    Wrist ulnar deviation    Wrist radial deviation    Wrist pronation    Wrist supination    (Blank rows = not tested; * = limited by pain )    UPPER EXTREMITY MMT:   MMT Right eval Left eval  Shoulder flexion 3+/5 3+/5  Shoulder extension    Shoulder abduction    Shoulder adduction    Shoulder internal rotation 3/5 3/5  Shoulder external rotation 3/5 3/5  Middle trapezius    Lower trapezius    Elbow flexion    Elbow extension    Wrist flexion    Wrist extension    Wrist ulnar deviation    Wrist radial deviation    Wrist pronation    Wrist supination    (Blank rows = not tested; * = limited by pain )   CERVICAL SPECIAL TESTS:  Sharp Purser Test: (-) Alar Ligament Test: (-) Distraction test: Negative   JOINT MOBILITY TESTING:  Limited Cervical sidebend to the right + left cervical rotation; left opening restriction   PALPATION:   Moderate tenderness to palpation left > right suboccipital, upper trap     TODAY'S TREATMENT:  DATE:   03/26/23  PT Discharge HEP  modification/ review  Self-care for ADL management, pain management   03/22/23 STM on B paracervicals and upper trapezius x 8' Supine chin tucks with cervical retraction on a ball x 3" x 10 Supine cervical rot B with a ball x 3" x 10 on each Sidelying open book with cervical tracking x 10 on each Supine Self hugs with 1 pillow on back x 20 Seated self-SNAGs rot with a towel x 3" x 5 on each Seated upper trapezius stretch x 30" x 2 Seated levator scapulae stretch x 30" x 2 Rows, 3" x 10, blue TB  Horizontal abd x 3" x 10, blue TB  03/15/23 3D thoracic excursion 10x  Standing: GTB lower trap pull down POE with pillow under hips Manual prone to periscapular mm, supine UT, suboccipital release UT Strech 2x 30" Levator stretch 2x 30"  Discussed seated posture, lumbar support   03/12/23 Manual to bil Upper traps in seated Seated cervical ROM full without issues UT stretch bil 2X30" Levator stretch bil 2X30" Doorway stretch 2X30" Prone extension to encourage thoracic extension 2 minutes Seated thoracic extension against chair (demonstration) Standing GTB scap retraction 10X  GTB rows 10X  GTB extension 10X  03/09/23 UBE, level 1, forward, 5' STM on B upper trapezius x 8' Supine chin tucks x 3" x 10 x 2 Supine cervical rot B x 3" x 10 on each Seated self-SNAGs rot with a towel x 3" x 5 on each Seated upper trapezius stretch x 30" x 2 Seated levator scapulae stretch x 30" x 3 Seated shoulder rolls, front/back x 10 each Scapular retraction with ER, RTB x 13" x 10 Doorway pec stretch at 60 abd x 30" x 2  03/06/23 PT Initial Eval Supine manual therapy to cervical spine   PATIENT EDUCATION: Education details: postural control/dysfunction  Person educated: Patient and Spouse Education method: Explanation Education comprehension: verbalized understanding  HOME EXERCISE PROGRAM: Access Code: [email or web address] URL: [email or web address] Date: 03/09/2023 Prepared  by: Krystal Clark  Exercises - Supine Cervical Rotation AROM on Pillow  - 1 x daily - 5 x weekly - 10 reps - 3 hold - Supine Chin Tuck  - 1 x daily - 5 x weekly - 10 reps - 3 hold - Seated Upper Trapezius Stretch  - 1 x daily - 5 x weekly - 3 reps - 30 hold - Seated Levator Scapulae Stretch  - 1 x daily - 5 x weekly - 3 reps - 30 hold - Shoulder Rolls in Sitting  - 1 x daily - 5 x weekly - 2 sets - 10 reps - Shoulder External Rotation and Scapular Retraction with Resistance  - 1 x daily - 5 x weekly - 2 sets - 10 reps - 3 hold - Doorway Pec Stretch at 60 Degrees Abduction with Arm Straight  - 1 x daily - 5 x weekly - 3 reps - 30 hold - Seated Assisted Cervical Rotation with Towel  - 1 x daily - 5 x weekly - 10 reps - 3 hold  03/12/23:  postural green theraband scap retraction, rows, shoulder extensions   03/15/23:  GTB lower trap pull down  3D thoracic excursion.  ASSESSMENT:   CLINICAL IMPRESSION:  Discharge Summary. Patient has shown minimal improvements in overall improvements. Patient has completed (6) visits since the initial evaluation and has met 4/4 stated rehab goal. At this moment, PT discharge patient to HEP due to meeting the stated rehab goals. Patient  has progressed with postural exercises but still is experiencing pre-syncopal episodes. PT performed positional Vertebral Artery testing with (-) symptoms. At this point, PT recommends patient to follow-up with referring provider. This PT also recommends further imaging and Vascular Specialist to help rule out Vertebral Artery involvement. D/C from PT at this moment.   EVAL: Patient is a 42 y.o. male who was seen today for physical therapy evaluation and treatment for neck pain/ cervicalgia due to poor posture. Patient presents to PT with the following objective impairments: decreased activity tolerance, decreased endurance, decreased ROM, decreased strength, hypomobility, increased fascial restrictions, impaired flexibility,  postural dysfunction, and pain. These impairments limit the patient in activities such as carrying, lifting, reach over head, and hygiene/grooming. These impairments also limit the patient in participation such as meal prep, cleaning, laundry, personal finances, interpersonal relationship, driving, community activity, occupation, and yard work. The patient will benefit from PT to address the limitations/impairments listed below to return to their prior level of function in the domains of activity and participation.   PERSONAL FACTORS: 1 comorbidity: PMH smoking  are also affecting patient's functional outcome.   REHAB POTENTIAL: Good  CLINICAL DECISION MAKING: Stable/uncomplicated  EVALUATION COMPLEXITY: Low  GOALS: Goals reviewed with patient? No  SHORT TERM GOALS: Target date: 3 sessions    Patient will be independent with a basic stretching/strengthening HEP  Baseline: Goal status: MET   2.  Patient will be able to lift an object of 10lbs from a hip height off of a stable surface with 1-2/10 pain to facilitate ADL completion and self-care  Baseline:  Goal status: IN PROGRESS   LONG TERM GOALS: Target date: 6 sessions   Patient will be independent with a comprehensive strengthening HEP  Baseline:  Goal status: MET   2.  Patient will be able to lift an object of 10lbs overhead with 1-2/10 pain to facilitate ADL completion and self-care  Baseline:  Goal status: MET  4. Patient will increase cervical ROM by 5 degrees into bilateral rotation, lateral flexion to improve ADL completion and self-care Baseline:  Goal status: IN PROGRESS   PLAN: PT FREQUENCY: 1-2x/week  PT DURATION:  6 sessions  PLANNED INTERVENTIONS: 97110-Therapeutic exercises, 97530- Therapeutic activity, 97112- Neuromuscular re-education, 97535- Self Care, 09323- Manual therapy, 97014- Electrical stimulation (unattended), [phone]- Electrical stimulation (manual), Patient/Family education, Taping, Dry  Needling, Joint mobilization, Joint manipulation, Spinal manipulation, Spinal mobilization, Vestibular training, Cryotherapy, and Moist heat  PLAN FOR NEXT SESSION: d/c    Seymour Bars PT, DPT  03/26/2023, 4:13 PM

## 2023-04-01 ENCOUNTER — Encounter: Payer: Self-pay | Admitting: Neurology

## 2023-04-03 NOTE — Telephone Encounter (Signed)
Please add him to the cancellation list.

## 2023-04-09 ENCOUNTER — Encounter: Payer: Self-pay | Admitting: Neurology

## 2023-04-09 ENCOUNTER — Ambulatory Visit (INDEPENDENT_AMBULATORY_CARE_PROVIDER_SITE_OTHER): Payer: 59 | Admitting: Neurology

## 2023-04-09 VITALS — BP 124/86 | HR 108 | Ht 72.0 in | Wt 170.0 lb

## 2023-04-09 DIAGNOSIS — M62838 Other muscle spasm: Secondary | ICD-10-CM | POA: Diagnosis not present

## 2023-04-09 DIAGNOSIS — R42 Dizziness and giddiness: Secondary | ICD-10-CM

## 2023-04-09 DIAGNOSIS — R55 Syncope and collapse: Secondary | ICD-10-CM | POA: Diagnosis not present

## 2023-04-09 DIAGNOSIS — M542 Cervicalgia: Secondary | ICD-10-CM | POA: Diagnosis not present

## 2023-04-09 MED ORDER — CYCLOBENZAPRINE HCL 10 MG PO TABS
10.0000 mg | ORAL_TABLET | Freq: Two times a day (BID) | ORAL | 3 refills | Status: AC | PRN
Start: 2023-04-09 — End: ?

## 2023-04-09 NOTE — Patient Instructions (Signed)
 MRI Cervical spine for further evaluation since PT was not beneficial per patient  Carotid US for evaluation for syncope with neck elevation  Start Tizanidine twice daily as needed for the muscle spasms Continue to follow up with PCP  Return as needed

## 2023-04-09 NOTE — Progress Notes (Signed)
 [facility]  PATIENT: Henry Peck DOB: 07-Oct-1980  REQUESTING CLINICIAN: [facility]* HISTORY FROM: Patient and spouse REASON FOR VISIT: Cervicalgia/ Presyncope    HISTORICAL  CHIEF COMPLAINT:  Chief Complaint  Patient presents with   Follow-up    Rm12, son present, Cervicalgia, Muscle spasms of neck, Postural dizziness with presyncope, Anxiety: Pt stated that the neck pain is still persistent and PT hasn't helped. Consult with vascular specialist to rule out arterial involvement was written on a note the pt handed to me.       INTERVAL HISTORY 04/09/2023:  Patient presents today for follow-up, he is accompanied by his son.  Last visit was in December, since then he has completed PT but tells me the PT was not helpful.  He was recommended by therapist for MRI cervical spine and carotid ultrasound.  He tells me that he still have muscle spasms.  Has not had any additional syncopal episode. Tells me that the massage sessions were helpful during doing PT. Today he woke up with cough, runny nose, stated that wife has been sick for the past 4 days, he has not come contacted his PCP.   HISTORY OF PRESENT ILLNESS:  Discussed the use of AI scribe software for clinical note transcription with the patient, who gave verbal consent to proceed.   History of Present Illness   The patient, with a history of anxiety, has been experiencing a constellation of symptoms for approximately a year. The initial episode occurred before a meal, where he experienced a sensation of impending sickness, followed by profuse sweating and visual blackouts, although consciousness was maintained throughout. These episodes have recurred multiple times, leading to multiple hospital visits. The symptoms include lightheadedness, a sensation of coldness in the extremities, particularly the feet, and muscle tightness in the upper body. He has been diagnosed with presyncope   The patient  also reported a peculiar taste in his mouth, which varies between sweet and a taste reminiscent of being ill. He also reported a sensation of a knot in his neck, which appears and disappears. He had US  of the neck which was normal. The patient has been treated for anxiety, with the addition of sertraline and Atarax to his regimen. However, he reported headaches and ear pain after starting sertraline. Dose has not been increased.    The patient also reported muscle spasms and a sensation of his muscles 'popping loose' or releasing, which he has been managing with over-the-counter sleep aids and Nyquil. He had a negative experience with a prescribed muscle relaxer, which he reported caused severe migraines. He was seen at [facility], had a cervical spine xray,  was recommended PT but he wanted to wait until his Neurology visit.    The patient's primary care provider had initiated investigations for a possible rheumatological condition, given a positive ANA result and the patient's report of cold extremities. However, the patient's symptoms do not seem to fit a clear rheumatological pattern.   The patient's symptoms have caused significant distress and have interfered with his daily activities, including work. Despite multiple investigations and treatments, the patient's symptoms persist, causing frustration due to the lack of a clear diagnosis.    OTHER MEDICAL CONDITIONS: Anxiety    REVIEW OF SYSTEMS: Full 14 system review of systems performed and negative with exception of: As noted in the HPI   ALLERGIES: Allergies  Allergen Reactions   Codeine Nausea And Vomiting   Duloxetine Other (See Comments)    Dizziness  Gabapentin      HOME MEDICATIONS: Outpatient Medications Prior to Visit  Medication Sig Dispense Refill   acetaminophen  (TYLENOL ) 500 MG tablet Take 500 mg by mouth every 6 (six) hours as needed.     doxylamine, Sleep, (UNISOM) 25 MG tablet Take 25 mg by mouth at bedtime.      hydrOXYzine (ATARAX) 25 MG tablet Take 25 mg by mouth every 4 (four) hours as needed for anxiety.     omeprazole (PRILOSEC) 20 MG capsule Take 20 mg by mouth daily.     Phenyleph-Doxylamine-DM-APAP (NYQUIL SEVERE COLD/FLU PO)      Phenylephrine-Doxylamine-DM (NYQUIL COUGH DM + CONGESTION) 5-6.25-10 MG/15ML LIQD Take 1 tablet by mouth at bedtime.     sertraline (ZOLOFT) 50 MG tablet Take 50 mg by mouth daily.     gabapentin  (NEURONTIN ) 300 MG capsule Take 1 capsule (300 mg total) by mouth at bedtime. 30 capsule 6   No facility-administered medications prior to visit.    PAST MEDICAL HISTORY: Past Medical History:  Diagnosis Date   Complication of anesthesia    extra thirsty-a little upset   Headache    History of kidney stones    possible one stone   Inguinal hernia    bilateral    PAST SURGICAL HISTORY: Past Surgical History:  Procedure Laterality Date   FRACTURE SURGERY Right ~18 years ago   hand, removed knuckle   INGUINAL HERNIA REPAIR Bilateral 08/14/2014   Procedure: LAPAROSCOPIC BILATERAL INGUINAL HERNIA REPAIR;  Surgeon: Lynda Leos, MD;  Location: [facility];  Service: General;  Laterality: Bilateral;   INSERTION OF MESH Bilateral 08/14/2014   Procedure: INSERTION OF MESH;  Surgeon: Lynda Leos, MD;  Location: [facility];  Service: General;  Laterality: Bilateral;   WISDOM TOOTH EXTRACTION      FAMILY HISTORY: History reviewed. No pertinent family history.  SOCIAL HISTORY: Social History   Socioeconomic History   Marital status: Married    Spouse name: Not on file   Number of children: 3   Years of education: Not on file   Highest education level: 12th grade  Occupational History   Not on file  Tobacco Use   Smoking status: Every Day    Current packs/day: 0.00    Average packs/day: 1 pack/day for 20.0 years (20.0 ttl pk-yrs)    Types: Cigarettes    Last attempt to quit: 12/26/2022    Years since quitting: 0.2   Smokeless tobacco: Never  Vaping Use    Vaping status: Never Used  Substance and Sexual Activity   Alcohol use: Yes    Alcohol/week: 21.0 standard drinks of alcohol    Types: 21 Standard drinks or equivalent per week    Comment: rare   Drug use: Yes    Types: Marijuana    Comment: last use 18 years ago   Sexual activity: Yes    Birth control/protection: None  Other Topics Concern   Not on file  Social History Narrative   ** Merged History Encounter **       Social Drivers of Health   Financial Resource Strain: Not on file  Food Insecurity: Not on file  Transportation Needs: Not on file  Physical Activity: Not on file  Stress: Not on file (02/03/2023)  Social Connections: Not on file  Intimate Partner Violence: Not on file    PHYSICAL EXAM  GENERAL EXAM/CONSTITUTIONAL: Vitals:  Vitals:   04/09/23 0749  BP: 124/86  Pulse: (!) 108  Weight: 170 lb (77.1 kg)  Height: 6' (  1.829 m)   Body mass index is 23.06 kg/m. Wt Readings from Last 3 Encounters:  04/09/23 170 lb (77.1 kg)  02/26/23 170 lb (77.1 kg)  01/17/23 174 lb (78.9 kg)   Patient is in no distress; well developed, nourished and groomed; neck is supple. Appears anxious   MUSCULOSKELETAL: Gait, strength, tone, movements noted in Neurologic exam below  NEUROLOGIC: MENTAL STATUS:      No data to display         awake, alert, oriented to person, place and time recent and remote memory intact normal attention and concentration language fluent, comprehension intact, naming intact fund of knowledge appropriate  CRANIAL NERVE:  2nd, 3rd, 4th, 6th - Visual fields full to confrontation, extraocular muscles intact, no nystagmus 5th - facial sensation symmetric 7th - facial strength symmetric 8th - hearing intact 9th - palate elevates symmetrically, uvula midline 11th - shoulder shrug symmetric 12th - tongue protrusion midline  MOTOR:  normal bulk and tone, full strength in the BUE, BLE  SENSORY:  normal and symmetric to light  touch  COORDINATION:  finger-nose-finger, fine finger movements normal  REFLEXES:  deep tendon reflexes present and symmetric  GAIT/STATION:  normal     DIAGNOSTIC DATA (LABS, IMAGING, TESTING) - I reviewed patient records, labs, notes, testing and imaging myself where available.  Lab Results  Component Value Date   WBC 10.9 (H) 12/25/2022   HGB 15.2 12/25/2022   HCT 44.5 12/25/2022   MCV 92.7 12/25/2022   PLT 372 12/25/2022      Component Value Date/Time   NA 138 12/25/2022 2000   K 3.7 12/25/2022 2000   CL 103 12/25/2022 2000   CO2 25 12/25/2022 2000   GLUCOSE 99 12/25/2022 2000   BUN 12 12/25/2022 2000   CREATININE 0.79 12/25/2022 2000   CALCIUM 9.5 12/25/2022 2000   GFRNONAA >60 12/25/2022 2000   No results found for: CHOL, HDL, LDLCALC, LDLDIRECT, TRIG, CHOLHDL No results found for: YHAJ8R No results found for: VITAMINB12 Lab Results  Component Value Date   TSH 1.790 07/26/2022    X-ray cervical spine done at 12/06/2022 at [facility]: Straightening of normal cervical lordosis.  Mild disc space narrowing at the mid cervical disc spaces, mild at C4/5 and C5/6.   ASSESSMENT AND PLAN  43 y.o. year old male with history of anxiety who is presenting for follow-up for cervicalgia, neck spasm and syncope.  Last visit was in early December.  He has completed PT, tells me that it was not beneficial.  Still experiencing pain.  He has not had any additional syncopal episode.  Plan will be to obtain MRI brain, carotid ultrasound and to start tizanidine twice daily as needed.  He tells me that gabapentin  caused him to have bilateral feet pain, we will discontinue medication.  Continue to follow with PCP return as needed. I will contact him to go over the results of the tests.    1. Cervicalgia   2. Muscle spasms of neck   3. Postural dizziness with presyncope     Patient Instructions  MRI Cervical spine for further evaluation since PT was not  beneficial per patient  Carotid US  for evaluation for syncope with neck elevation  Start Tizanidine twice daily as needed for the muscle spasms Continue to follow up with PCP  Return as needed   Orders Placed This Encounter  Procedures   MR CERVICAL SPINE WO CONTRAST   VAS US  CAROTID    Meds ordered this encounter  Medications   cyclobenzaprine  (FLEXERIL ) 10 MG tablet    Sig: Take 1 tablet (10 mg total) by mouth 2 (two) times daily as needed for muscle spasms.    Dispense:  60 tablet    Refill:  3    Return if symptoms worsen or fail to improve.    Pastor Falling, MD 04/09/2023, 8:48 AM  [facility] [address] [place], [place] [address] [phone]

## 2023-04-10 ENCOUNTER — Encounter: Payer: Self-pay | Admitting: Emergency Medicine

## 2023-04-10 ENCOUNTER — Ambulatory Visit
Admission: EM | Admit: 2023-04-10 | Discharge: 2023-04-10 | Disposition: A | Discharge status: Home / Self Care | Payer: 59 | Attending: Nurse Practitioner | Admitting: Nurse Practitioner

## 2023-04-10 DIAGNOSIS — R509 Fever, unspecified: Secondary | ICD-10-CM | POA: Diagnosis not present

## 2023-04-10 DIAGNOSIS — B349 Viral infection, unspecified: Secondary | ICD-10-CM

## 2023-04-10 LAB — POC COVID19/FLU A&B COMBO
Covid Antigen, POC: NEGATIVE
Influenza A Antigen, POC: NEGATIVE
Influenza B Antigen, POC: NEGATIVE

## 2023-04-10 MED ORDER — PROMETHAZINE-DM 6.25-15 MG/5ML PO SYRP: 5.0000 mL | ORAL_SOLUTION | Freq: Four times a day (QID) | ORAL | 0 refills | Status: AC | PRN

## 2023-04-10 MED ORDER — IBUPROFEN 800 MG PO TABS
800.0000 mg | ORAL_TABLET | Freq: Once | ORAL | Status: AC
  Administered 2023-04-10: 800 mg via ORAL

## 2023-04-10 MED ORDER — FLUTICASONE PROPIONATE 50 MCG/ACT NA SUSP: 2.0000 | Freq: Every day | NASAL | 0 refills | Status: AC

## 2023-04-10 NOTE — ED Provider Notes (Signed)
 [facility]    CSN: [id number] Arrival date & time: 04/10/23  1303      History   Chief Complaint No chief complaint on file.   HPI Henry Peck is a 43 y.o. male.   The history is provided by the patient.   Patient presents for complaints of fever, chills, body aches, and cough that started over the past 24 hours.  Patient is currently febrile at 102.7.  He denies headache, ear pain, wheezing, difficulty breathing, chest pain, abdominal pain, nausea, vomiting, diarrhea, or rash.  Patient reports he has been taking NyQuil for his symptoms.  States that his wife and children have been sick with the same or similar symptoms.  Past Medical History:  Diagnosis Date   Complication of anesthesia    extra thirsty-a little upset   Headache    History of kidney stones    possible one stone   Inguinal hernia    bilateral    Patient Active Problem List   Diagnosis Date Noted   Dizziness 01/17/2023   TOBACCO DEPENDENCE 05/24/2006    Past Surgical History:  Procedure Laterality Date   FRACTURE SURGERY Right ~18 years ago   hand, removed knuckle   INGUINAL HERNIA REPAIR Bilateral 08/14/2014   Procedure: LAPAROSCOPIC BILATERAL INGUINAL HERNIA REPAIR;  Surgeon: Lynda Leos, MD;  Location: [facility];  Service: General;  Laterality: Bilateral;   INSERTION OF MESH Bilateral 08/14/2014   Procedure: INSERTION OF MESH;  Surgeon: Lynda Leos, MD;  Location: [facility];  Service: General;  Laterality: Bilateral;   WISDOM TOOTH EXTRACTION         Home Medications    Prior to Admission medications   Medication Sig Start Date End Date Taking? Authorizing Provider  fluticasone  (FLONASE ) 50 MCG/ACT nasal spray Place 2 sprays into both nostrils daily. 04/10/23  Yes Leath-Warren, Etta PARAS, NP  promethazine -dextromethorphan (PROMETHAZINE -DM) 6.25-15 MG/5ML syrup Take 5 mLs by mouth 4 (four) times daily as needed. 04/10/23  Yes Leath-Warren, Etta PARAS, NP  acetaminophen   (TYLENOL ) 500 MG tablet Take 500 mg by mouth every 6 (six) hours as needed.    [provider]  cyclobenzaprine  (FLEXERIL ) 10 MG tablet Take 1 tablet (10 mg total) by mouth 2 (two) times daily as needed for muscle spasms. 04/09/23   Camara, Amadou, MD  doxylamine, Sleep, (UNISOM) 25 MG tablet Take 25 mg by mouth at bedtime.    [provider]  hydrOXYzine (ATARAX) 25 MG tablet Take 25 mg by mouth every 4 (four) hours as needed for anxiety.    [provider]  omeprazole (PRILOSEC) 20 MG capsule Take 20 mg by mouth daily.    [provider]  Phenyleph-Doxylamine-DM-APAP (NYQUIL SEVERE COLD/FLU PO)     [provider]  Phenylephrine-Doxylamine-DM (NYQUIL COUGH DM + CONGESTION) 5-6.25-10 MG/15ML LIQD Take 1 tablet by mouth at bedtime.    [provider]  sertraline (ZOLOFT) 50 MG tablet Take 50 mg by mouth daily. 02/07/23   [provider]    Family History History reviewed. No pertinent family history.  Social History Social History   Tobacco Use   Smoking status: Every Day    Current packs/day: 0.00    Average packs/day: 1 pack/day for 20.0 years (20.0 ttl pk-yrs)    Types: Cigarettes    Last attempt to quit: 12/26/2022    Years since quitting: 0.2   Smokeless tobacco: Never  Vaping Use   Vaping status: Never Used  Substance Use Topics  Alcohol use: Yes    Alcohol/week: 21.0 standard drinks of alcohol    Types: 21 Standard drinks or equivalent per week    Comment: rare   Drug use: Yes    Types: Marijuana    Comment: last use 18 years ago     Allergies   Codeine, Duloxetine, and Gabapentin    Review of Systems Review of Systems Per HPI  Physical Exam Triage Vital Signs ED Triage Vitals  Encounter Vitals Group     BP 04/10/23 1359 113/77     Systolic BP Percentile --      Diastolic BP Percentile --      Pulse Rate 04/10/23 1359 (!) 108     Resp 04/10/23 1359 20     Temp 04/10/23 1359 (!) 102.7 F (39.3 C)      Temp Source 04/10/23 1359 Oral     SpO2 04/10/23 1359 98 %     Weight --      Height --      Head Circumference --      Peak Flow --      Pain Score 04/10/23 1400 6     Pain Loc --      Pain Education --      Exclude from Growth Chart --    No data found.  Updated Vital Signs BP 113/77 (BP Location: Right Arm)   Pulse (!) 108   Temp (!) 102.7 F (39.3 C) (Oral)   Resp 20   SpO2 98%   Visual Acuity Right Eye Distance:   Left Eye Distance:   Bilateral Distance:    Right Eye Near:   Left Eye Near:    Bilateral Near:     Physical Exam Vitals and nursing note reviewed.  Constitutional:      General: He is not in acute distress.    Appearance: Normal appearance.  HENT:     Head: Normocephalic.     Right Ear: Tympanic membrane, ear canal and external ear normal.     Left Ear: Tympanic membrane, ear canal and external ear normal.     Nose: Congestion present.     Right Turbinates: Enlarged and swollen.     Left Turbinates: Enlarged and swollen.     Right Sinus: No maxillary sinus tenderness or frontal sinus tenderness.     Left Sinus: No maxillary sinus tenderness or frontal sinus tenderness.     Mouth/Throat:     Lips: Pink.     Mouth: Mucous membranes are moist.     Pharynx: Uvula midline. Postnasal drip present. No pharyngeal swelling, oropharyngeal exudate, posterior oropharyngeal erythema or uvula swelling.  Eyes:     Extraocular Movements: Extraocular movements intact.     Conjunctiva/sclera: Conjunctivae normal.     Pupils: Pupils are equal, round, and reactive to light.  Cardiovascular:     Rate and Rhythm: Normal rate and regular rhythm.     Pulses: Normal pulses.     Heart sounds: Normal heart sounds.  Pulmonary:     Effort: Pulmonary effort is normal. No respiratory distress.     Breath sounds: Normal breath sounds. No stridor. No wheezing, rhonchi or rales.  Abdominal:     General: Bowel sounds are normal.     Palpations: Abdomen is soft.      Tenderness: There is no abdominal tenderness.  Musculoskeletal:     Cervical back: Normal range of motion.  Lymphadenopathy:     Cervical: No cervical adenopathy.  Skin:    General:  Skin is warm and dry.  Neurological:     General: No focal deficit present.     Mental Status: He is alert and oriented to person, place, and time.  Psychiatric:        Mood and Affect: Mood normal.        Behavior: Behavior normal.      UC Treatments / Results  Labs (all labs ordered are listed, but only abnormal results are displayed) Labs Reviewed  POC COVID19/FLU A&B COMBO    EKG   Radiology No results found.  Procedures Procedures (including critical care time)  Medications Ordered in UC Medications  ibuprofen  (ADVIL ) tablet 800 mg (800 mg Oral Given 04/10/23 1406)    Initial Impression / Assessment and Plan / UC Course  I have reviewed the triage vital signs and the nursing notes.  Pertinent labs & imaging results that were available during my care of the patient were reviewed by me and considered in my medical decision making (see chart for details).  On exam, lung sounds are clear throughout, room air sats at 98%.  COVID/flu test is negative.  Suspect patient does have a viral illness given his current presentation.  Will provide symptomatic treatment with Promethazine  DM for his cough, and fluticasone  50 mcg nasal spray for nasal congestion.  Supportive care recommendations were provided and discussed with the patient to include fluids, rest, over-the-counter analgesics, and use of a humidifier at nighttime during sleep.  Discussed viral etiology with the patient and with follow-up will be indicated.  Patient was in agreement with this plan of care and verbalizes understanding.  All questions were answered.  Patient stable for discharge.  Work note was provided.  Final Clinical Impressions(s) / UC Diagnoses   Final diagnoses:  Viral illness  Fever, unspecified     Discharge  Instructions      Ova/flu test was negative.  Although the test was negative, do feel you have a viral illness.  It is important for you to increase your fluids and to allow for plenty of rest.  Recommend the use of Pedialyte or Gatorlyte to help prevent dehydration.  You may take over-the-counter Tylenol  or ibuprofen  as needed for pain, fever, or general discomfort.  I have provided you a prescription for medicine for your cough along with nasal spray to help with nasal congestion and runny nose.   Recommend use of a humidifier in your bedroom at nighttime during sleep and sleeping elevated on pillows while cough symptoms persist. May use normal saline nasal spray throughout the day for nasal congestion and runny nose. Symptoms should improve over the next several days, if symptoms fail to improve, or you feel symptoms are worsening, you may follow-up in this clinic or with your primary care physician for further evaluation. I have provided a work note for you for 3 days.  If you need days beyond the date of your return, please follow-up with your primary care physician for further evaluation. Follow-up as needed.     ED Prescriptions     Medication Sig Dispense Auth. Provider   promethazine -dextromethorphan (PROMETHAZINE -DM) 6.25-15 MG/5ML syrup Take 5 mLs by mouth 4 (four) times daily as needed. 118 mL Leath-Warren, Etta PARAS, NP   fluticasone  (FLONASE ) 50 MCG/ACT nasal spray Place 2 sprays into both nostrils daily. 16 g Leath-Warren, Etta PARAS, NP      PDMP not reviewed this encounter.   Gilmer Etta PARAS, NP 04/10/23 1445

## 2023-04-10 NOTE — Discharge Instructions (Addendum)
 The COVID/flu test was negative.  Although the test was negative, you most likely have a viral illness.  It is important for you to increase your fluids and to allow for plenty of rest.  Recommend the use of Pedialyte or Gatorlyte to help prevent dehydration.  You may take over-the-counter Tylenol  or ibuprofen  as needed for pain, fever, or general discomfort.  I have provided you a prescription for medicine for your cough along with nasal spray to help with nasal congestion and runny nose.   Recommend use of a humidifier in your bedroom at nighttime during sleep and sleeping elevated on pillows while cough symptoms persist. May use normal saline nasal spray throughout the day for nasal congestion and runny nose. Symptoms should improve over the next several days, if symptoms fail to improve, or you feel symptoms are worsening, you may follow-up in this clinic or with your primary care physician for further evaluation. I have provided a work note for you for 3 days.  If you need days beyond the date of your return, please follow-up with your primary care physician for further evaluation. Follow-up as needed.

## 2023-04-10 NOTE — ED Triage Notes (Signed)
 Body aches, fever, chills, cough since yesterday

## 2023-04-17 ENCOUNTER — Ambulatory Visit: Payer: 59

## 2023-04-17 DIAGNOSIS — M542 Cervicalgia: Secondary | ICD-10-CM

## 2023-04-17 DIAGNOSIS — M62838 Other muscle spasm: Secondary | ICD-10-CM | POA: Diagnosis not present

## 2023-04-19 ENCOUNTER — Encounter: Payer: Self-pay | Admitting: Neurology

## 2023-11-28 ENCOUNTER — Encounter: Payer: Self-pay | Admitting: Physician Assistant

## 2024-01-17 ENCOUNTER — Ambulatory Visit: Admitting: Physician Assistant

## 2024-02-19 ENCOUNTER — Ambulatory Visit: Admitting: Gastroenterology

## 2024-02-19 ENCOUNTER — Encounter: Payer: Self-pay | Admitting: Gastroenterology

## 2024-02-19 VITALS — BP 120/70 | HR 97 | Ht 72.0 in | Wt 181.8 lb

## 2024-02-19 DIAGNOSIS — R1084 Generalized abdominal pain: Secondary | ICD-10-CM | POA: Diagnosis not present

## 2024-02-19 DIAGNOSIS — R194 Change in bowel habit: Secondary | ICD-10-CM | POA: Diagnosis not present

## 2024-02-19 DIAGNOSIS — R140 Abdominal distension (gaseous): Secondary | ICD-10-CM | POA: Diagnosis not present

## 2024-02-19 NOTE — Progress Notes (Signed)
 [facility] Consult Note:  History: Henry Peck 02/19/2024  Referring provider: [facility]  Reason for consult/chief complaint: Abdominal Pain (Constipation, bloating)   Subjective  Prior history:  Primary care referral notes from August 2025 indicate patient has had multisystemic symptoms since a COVID infection with no clear diagnosis as of yet.  Has seen GI, rheumatology, dermatology. Labs August 2025 include normal CBC, thyroid  function, positive ANA (no titer)  Epic encounter for GI visits indicates he may have seen Dr. Jerrell Justice at [facility] in July 2018.  Those records are not visible in this EHR  Discussed the use of AI scribe software for clinical note transcription with the patient, who gave verbal consent to proceed.  History of Present Illness Henry Peck is a 43 year old male who presents with chronic abdominal discomfort and irregular bowel habits. He is accompanied by his wife. He was referred by his doctor at [facility] for a second opinion on his gastrointestinal symptoms. They felt he got no answers or help after endoscopic testing at [facility].  Chronic abdominal discomfort and altered bowel habits - Chronic abdominal discomfort and irregular bowel habits for 7-8 years, no clear trigger for onset. - Symptoms include bloating, visible abdominal distention, and alternating constipation and diarrhea - Constipation episodes last up to five days, followed by diarrhea with yellow and greenish stools - Sharp abdominal pain associated with constipation, sometimes relieved by passing gas or bowel movement - No blood in stools - No difficulty swallowing  Gastrointestinal evaluations - Upper endoscopy and colonoscopy performed in summer 2018-no reports available today but will request them - Findings (as he recalls) included redness and inflammation in the lower stomach and possibly upper intestines.  He is concerned  about the significance of that and whether or not he needs some follow-up. - No definitive diagnosis or effective treatment provided after these evaluations  Appetite and oral symptoms - Lack of appetite, forces himself to eat three times daily - Persistent funny taste in mouth  Cutaneous manifestations - Rash-like condition on chest and neck - Previously evaluated by dermatologist, diagnosed as warts, but he is not certain that is the case  Episodes of near syncope and neurological symptoms - Episodes of near syncope began approximately two years ago, characterized by nausea, profuse sweating, and feeling ice cold - Episodes have improved over time - Tingling sensations, muscle aches, and frequent cold sensations - Previously told by other doctors that he may have Raynaud's syndrome  Response to medications and substances - Abdominal bloating and pain symptoms sometimes improve with alcohol or Nyquil, but reluctant to rely on these for relief - Previously prescribed unknown medication for irritable bowel syndrome, which was ineffective   ROS:  Review of Systems  Constitutional:  Positive for fatigue. Negative for appetite change and unexpected weight change.  HENT:  Negative for mouth sores and voice change.   Eyes:  Negative for pain and redness.  Respiratory:  Negative for cough and shortness of breath.   Cardiovascular:  Negative for chest pain and palpitations.  Genitourinary:  Negative for dysuria and hematuria.  Musculoskeletal:  Positive for arthralgias. Negative for myalgias.  Skin:  Positive for rash. Negative for pallor.  Neurological:  Positive for light-headedness. Negative for weakness and headaches.  Hematological:  Negative for adenopathy.     Past Medical History: Past Medical History:  Diagnosis Date   Complication of anesthesia    extra thirsty-a little upset  Headache    History of kidney stones    possible one stone   Inguinal hernia    bilateral      Past Surgical History: Past Surgical History:  Procedure Laterality Date   FRACTURE SURGERY Right ~18 years ago   hand, removed knuckle   INGUINAL HERNIA REPAIR Bilateral 08/14/2014   Procedure: LAPAROSCOPIC BILATERAL INGUINAL HERNIA REPAIR;  Surgeon: Lynda Leos, MD;  Location: [facility];  Service: General;  Laterality: Bilateral;   INSERTION OF MESH Bilateral 08/14/2014   Procedure: INSERTION OF MESH;  Surgeon: Lynda Leos, MD;  Location: [facility];  Service: General;  Laterality: Bilateral;   WISDOM TOOTH EXTRACTION       Family History: No family history on file.  Social History: Social History   Socioeconomic History   Marital status: Married    Spouse name: Not on file   Number of children: 3   Years of education: Not on file   Highest education level: 12th grade  Occupational History   Not on file  Tobacco Use   Smoking status: Every Day    Current packs/day: 0.00    Average packs/day: 1 pack/day for 20.0 years (20.0 ttl pk-yrs)    Types: Cigarettes    Last attempt to quit: 12/26/2022    Years since quitting: 1.1   Smokeless tobacco: Never  Vaping Use   Vaping status: Never Used  Substance and Sexual Activity   Alcohol use: Yes    Alcohol/week: 21.0 standard drinks of alcohol    Types: 21 Standard drinks or equivalent per week    Comment: rare   Drug use: Yes    Types: Marijuana    Comment: last use 18 years ago   Sexual activity: Yes    Birth control/protection: None  Other Topics Concern   Not on file  Social History Narrative   ** Merged History Encounter **       Social Drivers of Health   Financial Resource Strain: Not on file  Food Insecurity: Not on file  Transportation Needs: Not on file  Physical Activity: Not on file  Stress: Not on file (02/03/2023)  Social Connections: Not on file    Allergies: Allergies  Allergen Reactions   Codeine Nausea And Vomiting   Duloxetine Other (See Comments)    Dizziness    Gabapentin      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 (Patient not taking: Reported on 02/19/2024)     cyclobenzaprine  (FLEXERIL ) 10 MG tablet Take 1 tablet (10 mg total) by mouth 2 (two) times daily as needed for muscle spasms. (Patient not taking: Reported on 02/19/2024) 60 tablet 3   doxylamine, Sleep, (UNISOM) 25 MG tablet Take 25 mg by mouth at bedtime. (Patient not taking: Reported on 02/19/2024)     fluticasone  (FLONASE ) 50 MCG/ACT nasal spray Place 2 sprays into both nostrils daily. (Patient not taking: Reported on 02/19/2024) 16 g 0   hydrOXYzine (ATARAX) 25 MG tablet Take 25 mg by mouth every 4 (four) hours as needed for anxiety. (Patient not taking: Reported on 02/19/2024)     omeprazole (PRILOSEC) 20 MG capsule Take 20 mg by mouth daily. (Patient not taking: Reported on 02/19/2024)     Phenyleph-Doxylamine-DM-APAP (NYQUIL SEVERE COLD/FLU PO)  (Patient not taking: Reported on 02/19/2024)     Phenylephrine-Doxylamine-DM (NYQUIL COUGH DM +  CONGESTION) 5-6.25-10 MG/15ML LIQD Take 1 tablet by mouth at bedtime. (Patient not taking: Reported on 02/19/2024)     promethazine -dextromethorphan (PROMETHAZINE -DM) 6.25-15 MG/5ML syrup Take 5 mLs by mouth 4 (four) times daily as needed. (Patient not taking: Reported on 02/19/2024) 118 mL 0   sertraline (ZOLOFT) 50 MG tablet Take 50 mg by mouth daily. (Patient not taking: Reported on 02/19/2024)     No current facility-administered medications for this visit.      ___________________________________________________________________ Objective   Exam:  BP 120/70   Pulse 97   Ht 6' (1.829 m)   Wt 181 lb 12.8 oz (82.5 kg)   BMI 24.66 kg/m  Wt Readings from Last 3 Encounters:  02/19/24 181 lb 12.8 oz (82.5 kg)  04/09/23 170 lb (77.1 kg)  02/26/23 170 lb (77.1 kg)   Wife was  with him for the entire visit General: Well-appearing, no muscle wasting Eyes: sclera anicteric, no redness ENT: oral mucosa moist without lesions, no cervical or supraclavicular lymphadenopathy CV: Regular without appreciable murmur, no JVD, no peripheral edema Resp: clear to auscultation bilaterally, normal RR and effort noted GI: soft, no focal tenderness, with active bowel sounds. No guarding or palpable organomegaly noted.  Not distended Skin; warm and dry, no jaundice.  Small patch of slightly raised and faintly erythematous area around the sternal notch.  No additional rash Neuro: awake, alert and oriented x 3. Normal gross motor function and fluent speech    Encounter Diagnoses  Name Primary?   Generalized abdominal pain Yes   Abdominal bloating    Altered bowel habits    At least 8 years of persistent digestive symptoms and then some additional systemic symptoms that seem to happen after COVID.  Previous endoscopic workup at [facility].  Not clear if he has had any imaging. Assessment & Plan Chronic abdominal pain and bloating with altered bowel habits Chronic abdominal pain, bloating, and altered bowel habits for 7-8 years with episodes of constipation and diarrhea.  Could be IBS as suggested prior prior GI provider, but must consider SIBO, even without apparent risk factors. Symptoms persistent but not worsening. - Requested records from [facility] for previous evaluations and tests. - Will order breath testing for SIBO.  Will review records when available and check to see if TI intubated as well as if biopsies were taken to rule out sprue, H. pylori and microscopic colitis (the latter less likely with alternating constipation and diarrhea)  Thank you for the courtesy of this consult.  Please call me with any questions or concerns.  Victory LITTIE Brand III  CC: Referring provider noted above

## 2024-02-19 NOTE — Patient Instructions (Addendum)
 You have been given a testing kit to check for small intestine bacterial overgrowth (SIBO) which is completed by a company named [facility]. Make sure to return your test in the mail using the return mailing label given to you along with the kit. The test order, your demographic and insurance information have all already been sent to the company. [facility] will collect an upfront charge of $109.00 for commercial insurance plans and $229.00 if you are paying cash. The potential remaining total after claim submission and review is $120.00. Make sure to discuss with [facility] PRIOR to having the test to see if they have gotten information from your insurance company as to how much your testing will cost out of pocket, if any. Please contact [facility] at phone number [phone] to get instructions regarding how to perform the test as our office is unable to give specific testing instructions.    _______________________________________________________  Food Guidelines for those with chronic digestive trouble:  Many people have difficulty digesting certain foods, causing a variety of distressing and embarrassing symptoms such as abdominal pain, bloating and gas.  These foods may need to be avoided or consumed in small amounts.  Here are some tips that might be helpful for you.  1.   Lactose intolerance is the difficulty or complete inability to digest lactose, the natural sugar in milk and anything made from milk.  This condition is harmless, common, and can begin any time during life.  Some people can digest a modest amount of lactose while others cannot tolerate any.  Also, not all dairy products contain equal amounts of lactose.  For example, hard cheeses such as parmesan have less lactose than soft cheeses such as cheddar.  Yogurt has less lactose than milk or cheese.  Many packaged foods (even many brands of bread) have milk, so read ingredient lists carefully.  It is difficult to  test for lactose intolerance, so just try avoiding lactose as much as possible for a week and see what happens with your symptoms.  If you seem to be lactose intolerant, the best plan is to avoid it (but make sure you get calcium from another source).  The next best thing is to use lactase enzyme supplements, available over the counter everywhere.  Just know that many lactose intolerant people need to take several tablets with each serving of dairy to avoid symptoms.  Lastly, a lot of restaurant food is made with milk or butter.  Many are things you might not suspect, such as mashed potatoes, rice and pasta (cooked with butter) and grilled items.  If you are lactose intolerant, it never hurts to ask your server what has milk or butter.  2.   Fiber is an important part of your diet, but not all fiber is well-tolerated.  Insoluble fiber such as bran is often consumed by normal gut bacteria and converted into gas.  Soluble fiber such as oats, squash, carrots and green beans are typically tolerated better.  3.   Some types of carbohydrates can be poorly digested.  Examples include: fructose (apples, cherries, pears, raisins and other dried fruits), fructans (onions, zucchini, large amounts of wheat), sorbitol/mannitol/xylitol and sucralose/Splenda (common artificial sweeteners), and raffinose (lentils, broccoli, cabbage, asparagus, brussel sprouts, many types of beans).  Do a programmer, multimedia for [employer] and you will find helpful information. Beano, a dietary supplement, will often help with raffinose-containing foods.  As with lactase tablets, you may need several per serving.  4.   Whenever possible, avoid  processed food&meats and chemical additives.  High fructose corn syrup, a common sweetener, may be difficult to digest.  Eggs and soy (comes from the soybean, and added to many foods now) are other common bloating/gassy foods.  5.  Regarding gluten:  gluten is a protein mainly found in wheat, but also rye  and barley.  There is a condition called celiac sprue, which is an inflammatory reaction in the small intestine causing a variety of digestive symptoms.  Blood testing is highly reliable to look for this condition, and sometimes upper endoscopy with small bowel biopsies may be necessary to make the diagnosis.  Many patients who test negative for celiac sprue report improvement in their digestive symptoms when they switch to a gluten-free diet.  However, in these non-celiac gluten sensitive patients, the true role of gluten in their symptoms is unclear.  Reducing carbohydrates in general may decrease the gas and bloating caused when gut bacteria consume carbs. Also, some of these patients may actually be intolerant of the baker's yeast in bread products rather than the gluten.  Flatbread and other reduced yeast breads might therefore be tolerated.  There is no specific testing available for most food intolerances, which are discovered mainly by dietary elimination.  Please do not embark on a gluten free diet unless directed by your doctor, as it is highly restrictive, and may lead to nutritional deficiencies if not carefully monitored.  Lastly, beware of internet claims offering personalized tests for food intolerances.  Such testing has no reliable scientific evidence to support its reliability and correlation to symptoms.    6.  The best advice is old advice, especially for those with chronic digestive trouble - try to eat clean.  Balanced diet, avoid processed food, plenty of fruits and vegetables, cut down the sugar, minimal alcohol, avoid tobacco. Make time to care for yourself, get enough sleep, exercise when you can, reduce stress.  Your guts will thank you for it.   - Dr. Victory Legrand [facility]  ____________________________________________________________   _______________________________________________________  If your blood pressure at your visit was 140/90 or greater,  please contact your primary care physician to follow up on this.  _______________________________________________________  If you are age 42 or older, your body mass index should be between 23-30. Your Body mass index is 24.66 kg/m. If this is out of the aforementioned range listed, please consider follow up with your Primary Care Provider.  If you are age 41 or younger, your body mass index should be between 19-25. Your Body mass index is 24.66 kg/m. If this is out of the aformentioned range listed, please consider follow up with your Primary Care Provider.   ________________________________________________________  The [facility] providers would like to encourage you to use MYCHART to communicate with providers for non-urgent requests or questions.  Due to long hold times on the telephone, sending your provider a message by [facility] may be a faster and more efficient way to get a response.  Please allow 48 business hours for a response.  Please remember that this is for non-urgent requests.  _______________________________________________________  [facility] is using a team-based approach to care.  Your team is made up of your doctor and two to three APPS. Our APPS (Nurse Practitioners and Physician Assistants) work with your physician to ensure care continuity for you. They are fully qualified to address your health concerns and develop a treatment plan. They communicate directly with your gastroenterologist to care for you. Seeing the Advanced Practice Practitioners on  your physician's team can help you by facilitating care more promptly, often allowing for earlier appointments, access to diagnostic testing, procedures, and other specialty referrals.

## 2024-03-12 ENCOUNTER — Telehealth: Payer: Self-pay | Admitting: Gastroenterology

## 2024-03-12 MED ORDER — RIFAXIMIN 550 MG PO TABS: 550.0000 mg | ORAL_TABLET | Freq: Three times a day (TID) | ORAL | 0 refills | Status: AC

## 2024-03-12 NOTE — Telephone Encounter (Signed)
 The pt returned call and has been advised and will pick up the prescription and call us  in 1 month with an update.  The pt will call [facility] and see If he can get records

## 2024-03-12 NOTE — Telephone Encounter (Signed)
 Xifaxan  has been sent as ordered   Left message on machine to call back

## 2024-03-12 NOTE — Telephone Encounter (Signed)
 Left message on machine to call back

## 2024-03-12 NOTE — Telephone Encounter (Signed)
 Breath testing results are equivocal but suggest this patient might have SIBO.  I recommend a course of rifaximin  if we can get insurance approval and it is affordable  Rifaximin  550 mg tablet 1 tablet 3 times daily for 14 days Dispense 42 tablets Refill 0   Would like to hear from him in about a month with an update on whether or not this helps his GI symptoms. Still no records received from [facility] after his visit with us .  VEAR Brand MD

## 2024-04-01 ENCOUNTER — Encounter: Payer: Self-pay | Admitting: Gastroenterology
